# Patient Record
Sex: Female | Born: 1990 | Race: Black or African American | Hispanic: No | Marital: Single | Smoking: Never smoker
Health system: Southern US, Community
[De-identification: ages and names within clinical notes are randomized; demographics above are authoritative.]

## PROBLEM LIST (undated history)

## (undated) DIAGNOSIS — I10 Essential (primary) hypertension: Secondary | ICD-10-CM

## (undated) DIAGNOSIS — Z6841 Body Mass Index (BMI) 40.0 and over, adult: Secondary | ICD-10-CM

## (undated) HISTORY — DX: Morbid (severe) obesity due to excess calories: E66.01

## (undated) HISTORY — DX: Essential (primary) hypertension: I10

## (undated) HISTORY — PX: NO PAST SURGERIES: SHX2092

## (undated) HISTORY — DX: Body Mass Index (BMI) 40.0 and over, adult: Z684

---

## 2011-10-14 ENCOUNTER — Emergency Department: Payer: Self-pay | Admitting: Emergency Medicine

## 2014-04-26 ENCOUNTER — Emergency Department: Payer: Self-pay | Admitting: Emergency Medicine

## 2014-05-02 ENCOUNTER — Ambulatory Visit: Payer: Self-pay | Admitting: Emergency Medicine

## 2015-02-19 NOTE — L&D Delivery Note (Signed)
Delivery Note At  a viable and floppy female was delivered via  (Presentation:OA ;  ).  APGAR:5 ,8 ; weight  . 5#9oz  Placenta status: delivered intact with marginal cord insertion and 3 vessel  Short Cord:  with the following complications:variable decels, tight [place]- cut oon perineum as I was unable to reduce .  Cord pH: awaiting results-  Anesthesia:  none Episiotomy:  none Lacerations:  superficial- no repair needed Suture Repair: NA Est. Blood Loss (mL):    Mom to postpartum.  Baby to observing under warmer at the moment.  Melody NIKE Shambley, CNM 08/04/2015, 9:28 PM

## 2015-07-20 ENCOUNTER — Ambulatory Visit (INDEPENDENT_AMBULATORY_CARE_PROVIDER_SITE_OTHER): Payer: BLUE CROSS/BLUE SHIELD | Admitting: Obstetrics and Gynecology

## 2015-07-20 ENCOUNTER — Ambulatory Visit (INDEPENDENT_AMBULATORY_CARE_PROVIDER_SITE_OTHER): Payer: BLUE CROSS/BLUE SHIELD

## 2015-07-20 ENCOUNTER — Other Ambulatory Visit: Payer: Self-pay | Admitting: Obstetrics and Gynecology

## 2015-07-20 ENCOUNTER — Encounter: Payer: Self-pay | Admitting: Obstetrics and Gynecology

## 2015-07-20 VITALS — BP 132/80 | HR 124 | Ht 66.0 in | Wt 322.9 lb

## 2015-07-20 DIAGNOSIS — N926 Irregular menstruation, unspecified: Secondary | ICD-10-CM

## 2015-07-20 DIAGNOSIS — Z3493 Encounter for supervision of normal pregnancy, unspecified, third trimester: Secondary | ICD-10-CM

## 2015-07-20 DIAGNOSIS — N912 Amenorrhea, unspecified: Secondary | ICD-10-CM

## 2015-07-20 DIAGNOSIS — Z6841 Body Mass Index (BMI) 40.0 and over, adult: Secondary | ICD-10-CM

## 2015-07-20 DIAGNOSIS — Z331 Pregnant state, incidental: Secondary | ICD-10-CM | POA: Diagnosis not present

## 2015-07-20 DIAGNOSIS — Z3490 Encounter for supervision of normal pregnancy, unspecified, unspecified trimester: Secondary | ICD-10-CM

## 2015-07-20 LAB — POCT URINE PREGNANCY: PREG TEST UR: POSITIVE — AB

## 2015-07-20 NOTE — Patient Instructions (Signed)
1. Prenatal vitamins daily 2. Pelvic ultrasound to confirm intrauterine pregnancy and EDD 3. Return in 1 week for new OB nursing visit

## 2015-07-21 LAB — CBC WITH DIFFERENTIAL/PLATELET
BASOS: 0 %
Basophils Absolute: 0 10*3/uL (ref 0.0–0.2)
EOS (ABSOLUTE): 0 10*3/uL (ref 0.0–0.4)
Eos: 0 %
HEMATOCRIT: 39.2 % (ref 34.0–46.6)
Hemoglobin: 13.1 g/dL (ref 11.1–15.9)
IMMATURE GRANS (ABS): 0.1 10*3/uL (ref 0.0–0.1)
Immature Granulocytes: 1 %
LYMPHS: 15 %
Lymphocytes Absolute: 1.6 10*3/uL (ref 0.7–3.1)
MCH: 29.4 pg (ref 26.6–33.0)
MCHC: 33.4 g/dL (ref 31.5–35.7)
MCV: 88 fL (ref 79–97)
MONOS ABS: 1 10*3/uL — AB (ref 0.1–0.9)
Monocytes: 9 %
NEUTROS ABS: 8 10*3/uL — AB (ref 1.4–7.0)
Neutrophils: 75 %
PLATELETS: 344 10*3/uL (ref 150–379)
RBC: 4.45 x10E6/uL (ref 3.77–5.28)
RDW: 14.6 % (ref 12.3–15.4)
WBC: 10.7 10*3/uL (ref 3.4–10.8)

## 2015-07-21 LAB — ABO AND RH: RH TYPE: POSITIVE

## 2015-07-21 LAB — SICKLE CELL SCREEN: SICKLE CELL SCREEN: NEGATIVE

## 2015-07-21 LAB — HIV ANTIBODY (ROUTINE TESTING W REFLEX): HIV Screen 4th Generation wRfx: NONREACTIVE

## 2015-07-21 LAB — ANTIBODY SCREEN: Antibody Screen: NEGATIVE

## 2015-07-21 LAB — HEPATITIS B SURFACE ANTIGEN: Hepatitis B Surface Ag: NEGATIVE

## 2015-07-21 LAB — VARICELLA ZOSTER ANTIBODY, IGG: Varicella zoster IgG: 396 index (ref >165)

## 2015-07-21 LAB — RPR: RPR: NONREACTIVE

## 2015-07-21 LAB — RUBELLA SCREEN: RUBELLA: 4.79 {index} (ref >0.99)

## 2015-07-22 LAB — GC/CHLAMYDIA PROBE AMP
CHLAMYDIA, DNA PROBE: POSITIVE — AB
Neisseria gonorrhoeae by PCR: NEGATIVE

## 2015-07-22 LAB — URINALYSIS, ROUTINE W REFLEX MICROSCOPIC
Bilirubin, UA: NEGATIVE
GLUCOSE, UA: NEGATIVE
NITRITE UA: NEGATIVE
RBC, UA: NEGATIVE
Urobilinogen, Ur: 0.2 mg/dL (ref 0.2–1.0)
pH, UA: 6 (ref 5.0–7.5)

## 2015-07-22 LAB — MONITOR DRUG PROFILE 14(MW)
Amphetamine Scrn, Ur: NEGATIVE ng/mL
BARBITURATE SCREEN URINE: NEGATIVE ng/mL
BENZODIAZEPINE SCREEN, URINE: NEGATIVE ng/mL
BUPRENORPHINE, URINE: NEGATIVE ng/mL
CANNABINOIDS UR QL SCN: NEGATIVE ng/mL
COCAINE(METAB.)SCREEN, URINE: NEGATIVE ng/mL
Creatinine(Crt), U: 335 mg/dL — ABNORMAL HIGH (ref 20.0–300.0)
FENTANYL, URINE: NEGATIVE pg/mL
MEPERIDINE SCREEN, URINE: NEGATIVE ng/mL
Methadone Screen, Urine: NEGATIVE ng/mL
OPIATE SCREEN URINE: NEGATIVE ng/mL
OXYCODONE+OXYMORPHONE UR QL SCN: NEGATIVE ng/mL
PH UR, DRUG SCRN: 5.7 (ref 4.5–8.9)
PHENCYCLIDINE QUANTITATIVE URINE: NEGATIVE ng/mL
PROPOXYPHENE SCREEN URINE: NEGATIVE ng/mL
SPECIFIC GRAVITY: 1.02
TRAMADOL SCREEN, URINE: NEGATIVE ng/mL

## 2015-07-22 LAB — MICROSCOPIC EXAMINATION
CASTS: NONE SEEN /LPF
Epithelial Cells (non renal): >10 /hpf — AB (ref 0–10)

## 2015-07-22 LAB — NICOTINE SCREEN, URINE: COTININE UR QL SCN: NEGATIVE ng/mL

## 2015-07-22 LAB — CULTURE, OB URINE

## 2015-07-22 LAB — URINE CULTURE, OB REFLEX

## 2015-07-23 DIAGNOSIS — O0990 Supervision of high risk pregnancy, unspecified, unspecified trimester: Secondary | ICD-10-CM | POA: Insufficient documentation

## 2015-07-23 DIAGNOSIS — Z6841 Body Mass Index (BMI) 40.0 and over, adult: Secondary | ICD-10-CM

## 2015-07-23 DIAGNOSIS — Z3490 Encounter for supervision of normal pregnancy, unspecified, unspecified trimester: Secondary | ICD-10-CM | POA: Insufficient documentation

## 2015-07-23 DIAGNOSIS — N926 Irregular menstruation, unspecified: Secondary | ICD-10-CM | POA: Insufficient documentation

## 2015-07-23 NOTE — Progress Notes (Signed)
GYN ENCOUNTER NOTE  Subjective:       Kim Ali is a 25 y.o. G1P0 female is here for gynecologic evaluation of the following issues:  1. Amenorrhea. 2.  Pregnancy confirmation.  25 year old African-American female G1, P0, unsure last menstrual period, with positive urine pregnancy test, with history of irregular cycles, presents for pregnancy confirmation.  No history of vaginal bleeding or pe  Patient does report mild nausea and vomiting as well as breast tenderness..     Gynecologic History No LMP recorded (lmp unknown). Patient is pregnant. Contraception: None Menarche-age 46. Intervals-irregular. Dysmenorrhea-negative Pap smear history-no previous Pap smears  Obstetric History OB History  Gravida Para Term Preterm AB SAB TAB Ectopic Multiple Living  1             # Outcome Date GA Lbr Len/2nd Weight Sex Delivery Anes PTL Lv  1 Current               History reviewed. No pertinent past medical history. Seasonal allergies. Increased body mass index Past Surgical History  Procedure Laterality Date  . No past surgeries      No current outpatient prescriptions on file prior to visit.   No current facility-administered medications on file prior to visit.    Allergies  Allergen Reactions  . Penicillin G Other (See Comments)    Social History   Social History  . Marital Status: Single    Spouse Name: N/A  . Number of Children: N/A  . Years of Education: N/A   Occupational History  . Not on file.   Social History Main Topics  . Smoking status: Never Smoker   . Smokeless tobacco: Not on file  . Alcohol Use: Yes  . Drug Use: No  . Sexual Activity: Yes    Birth Control/ Protection: None   Other Topics Concern  . Not on file   Social History Narrative  . No narrative on file    Family History  Problem Relation Age of Onset  . Diabetes Maternal Uncle   . Diabetes Paternal Uncle   . Breast cancer Maternal Grandmother     grt  . Diabetes  Paternal Grandfather   . Ovarian cancer Neg Hx   . Colon cancer Neg Hx   . Heart disease Neg Hx     The following portions of the patient's history were reviewed and updated as appropriate: allergies, current medications, past family history, past medical history, past social history, past surgical history and problem list.  Review of Systems Review of Systems - General ROS: negative for - chills, fatigue, fever, hot flashes, malaise or night sweats Hematological and Lymphatic ROS: negative for - bleeding problems or swollen lymph nodes Gastrointestinal ROS: negative for - abdominal pain, blood in stools, change in bowel habits and nausea/vomiting Musculoskeletal ROS: negative for - joint pain, muscle pain or muscular weakness Genito-Urinary ROS: negative for - change in menstrual cycle, dysmenorrhea, dyspareunia, dysuria, genital discharge, genital ulcers, hematuria, incontinence, irregular/heavy menses, nocturia or pelvic painjj  Objective:   BP 132/80 mmHg  Pulse 124  Ht 5\' 6"  (1.676 m)  Wt 322 lb 14.4 oz (146.466 kg)  BMI 52.14 kg/m2  LMP  (LMP Unknown) CONSTITUTIONAL: Well-developed, well-nourished female in no acute distress.  Physical exam-deferred     Assessment:   1. Amenorrhea - POCT urine pregnancy  2. Irregular periods/menstrual cycles - US OB Transvaginal; Future  3. Pregnancy-positive UPT  4. Morbid obesity with BMI of 45.0-49.9, adult (HCC)  5.  Supervision of normal pregnancy, third trimester - ABO AND RH  - Antibody screen - CBC with Differential/Platelet - Culture, OB Urine - GC/Chlamydia Probe Amp - Hepatitis B surface antigen - HIV antibody - Monitor Drug Profile 14(MW) - Nicotine screen, urine - RPR - Rubella screen - Sickle cell screen - Urinalysis, Routine w reflex microscopic (not at [facility]) - Varicella zoster antibody, IgG     Plan:  1.  Prenatal labs. 2.  Ultrasound to confirm estimated gestational age and EDC 3.  Prenatal vitamins  daily. 4.New OB counseling: The patient has been given an overview regarding routine prenatal care. Recommendations regarding diet, weight gain, and exercise in pregnancy were given. Prenatal testing, optional genetic testing, and ultrasound use in pregnancy were reviewed.  Benefits of Breast Feeding were discussed. The patient is encouraged to consider nursing her baby post partum.  A total of 30 minutes were spent face-to-face with the patient during the encounter with greater than 50% dealing with counseling and coordination of care.  Herold Harms, MD  Note: This dictation was prepared with Dragon dictation along with smaller phrase technology. Any transcriptional errors that result from this process are unintentional.

## 2015-07-24 ENCOUNTER — Telehealth: Payer: Self-pay

## 2015-07-24 MED ORDER — AZITHROMYCIN 1 G PO PACK: 1.0000 g | PACK | Freq: Once | ORAL | Status: DC

## 2015-07-24 NOTE — Telephone Encounter (Signed)
-----   Message from Herold HarmsMartin A Defrancesco, MD sent at 07/23/2015 11:48 AM EDT ----- Please notify - Abnormal Labs Zithromax 1 g by mouth is recommended; partner needs treated; [facility] needs notification

## 2015-07-24 NOTE — Telephone Encounter (Signed)
Pt aware. Med erx. Pt aware to have partner or partners treated. Will need toc in 4 weeks.

## 2015-07-26 ENCOUNTER — Ambulatory Visit (INDEPENDENT_AMBULATORY_CARE_PROVIDER_SITE_OTHER): Payer: BLUE CROSS/BLUE SHIELD | Admitting: Obstetrics and Gynecology

## 2015-07-26 ENCOUNTER — Encounter: Payer: Self-pay | Admitting: Obstetrics and Gynecology

## 2015-07-26 VITALS — BP 131/97 | HR 100 | Wt 321.6 lb

## 2015-07-26 DIAGNOSIS — Z113 Encounter for screening for infections with a predominantly sexual mode of transmission: Secondary | ICD-10-CM

## 2015-07-26 DIAGNOSIS — Z1389 Encounter for screening for other disorder: Secondary | ICD-10-CM

## 2015-07-26 DIAGNOSIS — Z369 Encounter for antenatal screening, unspecified: Secondary | ICD-10-CM

## 2015-07-26 DIAGNOSIS — Z36 Encounter for antenatal screening of mother: Secondary | ICD-10-CM

## 2015-07-26 DIAGNOSIS — O0933 Supervision of pregnancy with insufficient antenatal care, third trimester: Secondary | ICD-10-CM | POA: Insufficient documentation

## 2015-07-26 DIAGNOSIS — Z23 Encounter for immunization: Secondary | ICD-10-CM

## 2015-07-26 LAB — POCT URINALYSIS DIPSTICK
Bilirubin, UA: NEGATIVE
Blood, UA: NEGATIVE
GLUCOSE UA: NEGATIVE
Ketones, UA: NEGATIVE
Leukocytes, UA: NEGATIVE
NITRITE UA: NEGATIVE
PROTEIN UA: NEGATIVE
SPEC GRAV UA: 1.025
UROBILINOGEN UA: NEGATIVE
pH, UA: 6

## 2015-07-26 MED ORDER — TETANUS-DIPHTH-ACELL PERTUSSIS 5-2.5-18.5 LF-MCG/0.5 IM SUSP
0.5000 mL | Freq: Once | INTRAMUSCULAR | Status: AC
  Administered 2015-07-26: 0.5 mL via INTRAMUSCULAR

## 2015-07-26 NOTE — Patient Instructions (Addendum)
Braxton Hicks Contractions Contractions of the uterus can occur throughout pregnancy. Contractions are not always a sign that you are in labor.  WHAT ARE BRAXTON HICKS CONTRACTIONS?  Contractions that occur before labor are called Braxton Hicks contractions, or false labor. Toward the end of pregnancy (32-34 weeks), these contractions can develop more often and may become more forceful. This is not true labor because these contractions do not result in opening (dilatation) and thinning of the cervix. They are sometimes difficult to tell apart from true labor because these contractions can be forceful and people have different pain tolerances. You should not feel embarrassed if you go to the hospital with false labor. Sometimes, the only way to tell if you are in true labor is for your health care provider to look for changes in the cervix. If there are no prenatal problems or other health problems associated with the pregnancy, it is completely safe to be sent home with false labor and await the onset of true labor. HOW CAN YOU TELL THE DIFFERENCE BETWEEN TRUE AND FALSE LABOR? False Labor  The contractions of false labor are usually shorter and not as hard as those of true labor.   The contractions are usually irregular.   The contractions are often felt in the front of the lower abdomen and in the groin.   The contractions may go away when you walk around or change positions while lying down.   The contractions get weaker and are shorter lasting as time goes on.   The contractions do not usually become progressively stronger, regular, and closer together as with true labor.  True Labor  Contractions in true labor last 30-70 seconds, become very regular, usually become more intense, and increase in frequency.   The contractions do not go away with walking.   The discomfort is usually felt in the top of the uterus and spreads to the lower abdomen and low back.   True labor can be  determined by your health care provider with an exam. This will show that the cervix is dilating and getting thinner.  WHAT TO REMEMBER  Keep up with your usual exercises and follow other instructions given by your health care provider.   Take medicines as directed by your health care provider.   Keep your regular prenatal appointments.   Eat and drink lightly if you think you are going into labor.   If Braxton Hicks contractions are making you uncomfortable:   Change your position from lying down or resting to walking, or from walking to resting.   Sit and rest in a tub of warm water.   Drink 2-3 glasses of water. Dehydration may cause these contractions.   Do slow and deep breathing several times an hour.  WHEN SHOULD I SEEK IMMEDIATE MEDICAL CARE? Seek immediate medical care if:  Your contractions become stronger, more regular, and closer together.   You have fluid leaking or gushing from your vagina.   You have a fever.   You pass blood-tinged mucus.   You have vaginal bleeding.   You have continuous abdominal pain.   You have low back pain that you never had before.   You feel your baby's head pushing down and causing pelvic pressure.   Your baby is not moving as much as it used to.    This information is not intended to replace advice given to you by your health care provider. Make sure you discuss any questions you have with your health care   provider.   Document Released: 02/04/2005 Document Revised: 02/09/2013 Document Reviewed: 11/16/2012 Elsevier Interactive Patient Education 2016 Elsevier Inc.  Tdap Vaccine (Tetanus, Diphtheria and Pertussis): What You Need to Know 1. Why get vaccinated? Tetanus, diphtheria and pertussis are very serious diseases. Tdap vaccine can protect us from these diseases. And, Tdap vaccine given to pregnant women can protect newborn babies against pertussis. TETANUS (Lockjaw) is rare in the [place] today. It  causes painful muscle tightening and stiffness, usually all over the body.  It can lead to tightening of muscles in the head and neck so you can't open your mouth, swallow, or sometimes even breathe. Tetanus kills about 1 out of 10 people who are infected even after receiving the best medical care. DIPHTHERIA is also rare in the [place] today. It can cause a thick coating to form in the back of the throat.  It can lead to breathing problems, heart failure, paralysis, and death. PERTUSSIS (Whooping Cough) causes severe coughing spells, which can cause difficulty breathing, vomiting and disturbed sleep.  It can also lead to weight loss, incontinence, and rib fractures. Up to 2 in 100 adolescents and 5 in 100 adults with pertussis are hospitalized or have complications, which could include pneumonia or death. These diseases are caused by bacteria. Diphtheria and pertussis are spread from person to person through secretions from coughing or sneezing. Tetanus enters the body through cuts, scratches, or wounds. Before vaccines, as many as 200,000 cases of diphtheria, 200,000 cases of pertussis, and hundreds of cases of tetanus, were reported in the [place] each year. Since vaccination began, reports of cases for tetanus and diphtheria have dropped by about 99% and for pertussis by about 80%. 2. Tdap vaccine Tdap vaccine can protect adolescents and adults from tetanus, diphtheria, and pertussis. One dose of Tdap is routinely given at age 11 or 12. People who did not get Tdap at that age should get it as soon as possible. Tdap is especially important for healthcare professionals and anyone having close contact with a baby younger than 12 months. Pregnant women should get a dose of Tdap during every pregnancy, to protect the newborn from pertussis. Infants are most at risk for severe, life-threatening complications from pertussis. Another vaccine, called Td, protects against tetanus and diphtheria,  but not pertussis. A Td booster should be given every 10 years. Tdap may be given as one of these boosters if you have never gotten Tdap before. Tdap may also be given after a severe cut or burn to prevent tetanus infection. Your doctor or the person giving you the vaccine can give you more information. Tdap may safely be given at the same time as other vaccines. 3. Some people should not get this vaccine  A person who has ever had a life-threatening allergic reaction after a previous dose of any diphtheria, tetanus or pertussis containing vaccine, OR has a severe allergy to any part of this vaccine, should not get Tdap vaccine. Tell the person giving the vaccine about any severe allergies.  Anyone who had coma or long repeated seizures within 7 days after a childhood dose of DTP or DTaP, or a previous dose of Tdap, should not get Tdap, unless a cause other than the vaccine was found. They can still get Td.  Talk to your doctor if you:  have seizures or another nervous system problem,  had severe pain or swelling after any vaccine containing diphtheria, tetanus or pertussis,  ever had a condition called Guillain-Barr Syndrome (  GBS),  aren't feeling well on the day the shot is scheduled. 4. Risks With any medicine, including vaccines, there is a chance of side effects. These are usually mild and go away on their own. Serious reactions are also possible but are rare. Most people who get Tdap vaccine do not have any problems with it. Mild problems following Tdap (Did not interfere with activities)  Pain where the shot was given (about 3 in 4 adolescents or 2 in 3 adults)  Redness or swelling where the shot was given (about 1 person in 5)  Mild fever of at least 100.4F (up to about 1 in 25 adolescents or 1 in 100 adults)  Headache (about 3 or 4 people in 10)  Tiredness (about 1 person in 3 or 4)  Nausea, vomiting, diarrhea, stomach ache (up to 1 in 4 adolescents or 1 in 10  adults)  Chills, sore joints (about 1 person in 10)  Body aches (about 1 person in 3 or 4)  Rash, swollen glands (uncommon) Moderate problems following Tdap (Interfered with activities, but did not require medical attention)  Pain where the shot was given (up to 1 in 5 or 6)  Redness or swelling where the shot was given (up to about 1 in 16 adolescents or 1 in 12 adults)  Fever over 102F (about 1 in 100 adolescents or 1 in 250 adults)  Headache (about 1 in 7 adolescents or 1 in 10 adults)  Nausea, vomiting, diarrhea, stomach ache (up to 1 or 3 people in 100)  Swelling of the entire arm where the shot was given (up to about 1 in 500). Severe problems following Tdap (Unable to perform usual activities; required medical attention)  Swelling, severe pain, bleeding and redness in the arm where the shot was given (rare). Problems that could happen after any vaccine:  People sometimes faint after a medical procedure, including vaccination. Sitting or lying down for about 15 minutes can help prevent fainting, and injuries caused by a fall. Tell your doctor if you feel dizzy, or have vision changes or ringing in the ears.  Some people get severe pain in the shoulder and have difficulty moving the arm where a shot was given. This happens very rarely.  Any medication can cause a severe allergic reaction. Such reactions from a vaccine are very rare, estimated at fewer than 1 in a million doses, and would happen within a few minutes to a few hours after the vaccination. As with any medicine, there is a very remote chance of a vaccine causing a serious injury or death. The safety of vaccines is always being monitored. For more information, visit: www.cdc.gov/vaccinesafety/ 5. What if there is a serious problem? What should I look for?  Look for anything that concerns you, such as signs of a severe allergic reaction, very high fever, or unusual behavior.  Signs of a severe allergic reaction  can include hives, swelling of the face and throat, difficulty breathing, a fast heartbeat, dizziness, and weakness. These would usually start a few minutes to a few hours after the vaccination. What should I do?  If you think it is a severe allergic reaction or other emergency that can't wait, call 9-1-1 or get the person to the nearest hospital. Otherwise, call your doctor.  Afterward, the reaction should be reported to the Vaccine Adverse Event Reporting System (VAERS). Your doctor might file this report, or you can do it yourself through the VAERS web site at www.vaers.hhs.gov, or by calling   1-800-822-7967. VAERS does not give medical advice.  6. The National Vaccine Injury Compensation Program The National Vaccine Injury Compensation Program (VICP) is a federal program that was created to compensate people who may have been injured by certain vaccines. Persons who believe they may have been injured by a vaccine can learn about the program and about filing a claim by calling 1-800-338-2382 or visiting the VICP website at www.hrsa.gov/vaccinecompensation. There is a time limit to file a claim for compensation. 7. How can I learn more?  Ask your doctor. He or she can give you the vaccine package insert or suggest other sources of information.  Call your local or state health department.  Contact the Centers for Disease Control and Prevention (CDC):  Call 1-800-232-4636 (1-800-CDC-INFO) or  Visit CDC's website at www.cdc.gov/vaccines CDC Tdap Vaccine VIS (04/13/13)   This information is not intended to replace advice given to you by your health care provider. Make sure you discuss any questions you have with your health care provider.   Document Released: 08/06/2011 Document Revised: 02/25/2014 Document Reviewed: 05/19/2013 Elsevier Interactive Patient Education 2016 Elsevier Inc.   

## 2015-07-26 NOTE — Progress Notes (Signed)
NEW OB HISTORY AND PHYSICAL  SUBJECTIVE:       Kim Ali is a 25 y.o. G1P0 female, No LMP recorded (lmp unknown). Patient is pregnant., Estimated Date of Delivery: 08/20/15, 7047w3d, presents today for establishment of Prenatal Care. She has no unusual complaints and complains of nothing      Gynecologic History No LMP recorded (lmp unknown). Patient is pregnant. Unknown Contraception: none Last Pap: unsure. Results were: normal  Obstetric History OB History  Gravida Para Term Preterm AB SAB TAB Ectopic Multiple Living  1             # Outcome Date GA Lbr Len/2nd Weight Sex Delivery Anes PTL Lv  1 Current               History reviewed. No pertinent past medical history.  Past Surgical History  Procedure Laterality Date  . No past surgeries      Current Outpatient Prescriptions on File Prior to Visit  Medication Sig Dispense Refill  . azithromycin (ZITHROMAX) 1 g powder Take 1 packet (1 g total) by mouth once. 1 each 0   No current facility-administered medications on file prior to visit.    Allergies  Allergen Reactions  . Penicillin G Other (See Comments)    Social History   Social History  . Marital Status: Single    Spouse Name: N/A  . Number of Children: N/A  . Years of Education: N/A   Occupational History  . Not on file.   Social History Main Topics  . Smoking status: Never Smoker   . Smokeless tobacco: Never Used  . Alcohol Use: 0.0 oz/week    0 Standard drinks or equivalent per week  . Drug Use: No  . Sexual Activity:    Partners: Male    Birth Control/ Protection: None   Other Topics Concern  . Not on file   Social History Narrative    Family History  Problem Relation Age of Onset  . Diabetes Maternal Uncle   . Diabetes Paternal Uncle   . Breast cancer Maternal Grandmother     grt  . Diabetes Paternal Grandfather   . Ovarian cancer Neg Hx   . Colon cancer Neg Hx   . Heart disease Neg Hx     The following portions of  the patient's history were reviewed and updated as appropriate: allergies, current medications, past OB history, past medical history, past surgical history, past family history, past social history, and problem list.    OBJECTIVE: Initial Physical Exam (New OB)  GENERAL APPEARANCE: alert, well appearing, in no apparent distress, oriented to person, place and time HEAD: normocephalic, atraumatic MOUTH: mucous membranes moist, pharynx normal without lesions and dental hygiene good THYROID: no thyromegaly or masses present BREASTS: not examined LUNGS: clear to auscultation, no wheezes, rales or rhonchi, symmetric air entry HEART: regular rate and rhythm, no murmurs ABDOMEN: obese, fundus soft, nontender 37 cm and FHT present EXTREMITIES: no redness or tenderness in the calves or thighs SKIN: normal coloration and turgor, no rashes LYMPH NODES: no adenopathy palpable NEUROLOGIC: alert, oriented, normal speech, no focal findings or movement disorder noted  PELVIC EXAM EXTERNAL GENITALIA: normal appearing vulva with no masses, tenderness or lesions VAGINA: no abnormal discharge or lesions CERVIX: no lesions or cervical motion tenderness UTERUS: gravid and consistent with 37 weeks  ASSESSMENT: IUP with late entry to care Morbid obesity +CMZ- treated 07/20/15  PLAN: Prenatal care TOC in 3 weeks, labor precautions discussed See orders

## 2015-07-31 ENCOUNTER — Other Ambulatory Visit: Payer: Self-pay | Admitting: Obstetrics and Gynecology

## 2015-07-31 DIAGNOSIS — O99820 Streptococcus B carrier state complicating pregnancy: Secondary | ICD-10-CM

## 2015-08-01 ENCOUNTER — Other Ambulatory Visit: Payer: Self-pay | Admitting: Obstetrics and Gynecology

## 2015-08-01 LAB — STREP GP B SUSCEPTIBILITY

## 2015-08-01 LAB — STREP GP B NAA+RFLX: Strep Gp B NAA+Rflx: POSITIVE — AB

## 2015-08-02 ENCOUNTER — Ambulatory Visit (INDEPENDENT_AMBULATORY_CARE_PROVIDER_SITE_OTHER): Payer: BLUE CROSS/BLUE SHIELD | Admitting: Obstetrics and Gynecology

## 2015-08-02 ENCOUNTER — Encounter: Payer: Self-pay | Admitting: Obstetrics and Gynecology

## 2015-08-02 VITALS — BP 130/80 | HR 76 | Wt 323.4 lb

## 2015-08-02 DIAGNOSIS — Z22330 Carrier of Group B streptococcus: Secondary | ICD-10-CM

## 2015-08-02 DIAGNOSIS — Z6841 Body Mass Index (BMI) 40.0 and over, adult: Secondary | ICD-10-CM

## 2015-08-02 DIAGNOSIS — O99820 Streptococcus B carrier state complicating pregnancy: Secondary | ICD-10-CM

## 2015-08-02 DIAGNOSIS — O0933 Supervision of pregnancy with insufficient antenatal care, third trimester: Secondary | ICD-10-CM

## 2015-08-02 DIAGNOSIS — Z3483 Encounter for supervision of other normal pregnancy, third trimester: Secondary | ICD-10-CM

## 2015-08-02 DIAGNOSIS — Z3493 Encounter for supervision of normal pregnancy, unspecified, third trimester: Secondary | ICD-10-CM

## 2015-08-02 LAB — POCT URINALYSIS DIPSTICK
Bilirubin, UA: NEGATIVE
Glucose, UA: NEGATIVE
Ketones, UA: NEGATIVE
Leukocytes, UA: NEGATIVE
NITRITE UA: NEGATIVE
PH UA: 6
RBC UA: NEGATIVE
SPEC GRAV UA: 1.025
UROBILINOGEN UA: NEGATIVE

## 2015-08-02 NOTE — Progress Notes (Signed)
ROB: Denies major complaints. GBS+, will need abx in labor. Given handout. Discussed contraception, considering IUD. Patient considering breastfeeding. Will need TOC for Chlamydia in 2 weeks. Labor precautions. RTC in 1 week.

## 2015-08-04 ENCOUNTER — Encounter: Payer: Self-pay | Admitting: *Deleted

## 2015-08-04 ENCOUNTER — Inpatient Hospital Stay
Admission: EM | Admit: 2015-08-04 | Discharge: 2015-08-06 | DRG: 775 | Disposition: A | Discharge status: Home / Self Care | Payer: BLUE CROSS/BLUE SHIELD | Attending: Obstetrics and Gynecology | Admitting: Obstetrics and Gynecology

## 2015-08-04 DIAGNOSIS — Z6841 Body Mass Index (BMI) 40.0 and over, adult: Secondary | ICD-10-CM | POA: Diagnosis not present

## 2015-08-04 DIAGNOSIS — Z3403 Encounter for supervision of normal first pregnancy, third trimester: Secondary | ICD-10-CM

## 2015-08-04 DIAGNOSIS — Z3A37 37 weeks gestation of pregnancy: Secondary | ICD-10-CM

## 2015-08-04 DIAGNOSIS — Z880 Allergy status to penicillin: Secondary | ICD-10-CM

## 2015-08-04 DIAGNOSIS — O99214 Obesity complicating childbirth: Secondary | ICD-10-CM | POA: Diagnosis present

## 2015-08-04 DIAGNOSIS — O4202 Full-term premature rupture of membranes, onset of labor within 24 hours of rupture: Principal | ICD-10-CM | POA: Diagnosis present

## 2015-08-04 DIAGNOSIS — O4290 Premature rupture of membranes, unspecified as to length of time between rupture and onset of labor, unspecified weeks of gestation: Secondary | ICD-10-CM | POA: Diagnosis present

## 2015-08-04 DIAGNOSIS — Z3490 Encounter for supervision of normal pregnancy, unspecified, unspecified trimester: Secondary | ICD-10-CM

## 2015-08-04 DIAGNOSIS — O99824 Streptococcus B carrier state complicating childbirth: Secondary | ICD-10-CM | POA: Diagnosis present

## 2015-08-04 LAB — CBC
HCT: 38.8 % (ref 35.0–47.0)
HEMOGLOBIN: 13 g/dL (ref 12.0–16.0)
MCH: 29.4 pg (ref 26.0–34.0)
MCHC: 33.3 g/dL (ref 32.0–36.0)
MCV: 88.1 fL (ref 80.0–100.0)
PLATELETS: 288 10*3/uL (ref 150–440)
RBC: 4.41 MIL/uL (ref 3.80–5.20)
RDW: 15 % — ABNORMAL HIGH (ref 11.5–14.5)
WBC: 11.6 10*3/uL — ABNORMAL HIGH (ref 3.6–11.0)

## 2015-08-04 LAB — TYPE AND SCREEN
ABO/RH(D): A POS
Antibody Screen: NEGATIVE

## 2015-08-04 LAB — CHLAMYDIA/NGC RT PCR (ARMC ONLY)
CHLAMYDIA TR: NOT DETECTED
N gonorrhoeae: NOT DETECTED

## 2015-08-04 MED ORDER — OXYTOCIN 40 UNITS IN LACTATED RINGERS INFUSION - SIMPLE MED
INTRAVENOUS | Status: AC
  Administered 2015-08-04: 2 m[IU]/min via INTRAVENOUS
  Filled 2015-08-04: qty 1000

## 2015-08-04 MED ORDER — SOD CITRATE-CITRIC ACID 500-334 MG/5ML PO SOLN
ORAL | Status: AC
  Filled 2015-08-04: qty 15

## 2015-08-04 MED ORDER — LIDOCAINE HCL (PF) 1 % IJ SOLN
INTRAMUSCULAR | Status: AC
Start: 2015-08-04 — End: 2015-08-04
  Filled 2015-08-04: qty 30

## 2015-08-04 MED ORDER — LACTATED RINGERS IV SOLN
INTRAVENOUS | Status: DC
  Administered 2015-08-04: 125 mL/h via INTRAVENOUS

## 2015-08-04 MED ORDER — OXYTOCIN 40 UNITS IN LACTATED RINGERS INFUSION - SIMPLE MED
1.0000 m[IU]/min | INTRAVENOUS | Status: DC
  Administered 2015-08-04: 2 m[IU]/min via INTRAVENOUS

## 2015-08-04 MED ORDER — FENTANYL CITRATE (PF) 100 MCG/2ML IJ SOLN
50.0000 ug | INTRAMUSCULAR | Status: DC | PRN
  Administered 2015-08-04 (×4): 100 ug via INTRAVENOUS
  Filled 2015-08-04 (×4): qty 2

## 2015-08-04 MED ORDER — ONDANSETRON HCL 4 MG/2ML IJ SOLN: 4.0000 mg | Freq: Four times a day (QID) | INTRAMUSCULAR | Status: DC | PRN

## 2015-08-04 MED ORDER — SOD CITRATE-CITRIC ACID 500-334 MG/5ML PO SOLN: 30.0000 mL | ORAL | Status: DC | PRN

## 2015-08-04 MED ORDER — CLINDAMYCIN PHOSPHATE 900 MG/50ML IV SOLN
900.0000 mg | Freq: Three times a day (TID) | INTRAVENOUS | Status: DC
  Administered 2015-08-04 (×2): 900 mg via INTRAVENOUS
  Filled 2015-08-04 (×4): qty 50

## 2015-08-04 MED ORDER — TERBUTALINE SULFATE 1 MG/ML IJ SOLN: 0.2500 mg | Freq: Once | INTRAMUSCULAR | Status: DC | PRN

## 2015-08-04 MED ORDER — OXYTOCIN 10 UNIT/ML IJ SOLN
INTRAMUSCULAR | Status: AC
  Filled 2015-08-04: qty 2

## 2015-08-04 MED ORDER — OXYTOCIN 40 UNITS IN LACTATED RINGERS INFUSION - SIMPLE MED
2.5000 [IU]/h | INTRAVENOUS | Status: DC
  Administered 2015-08-04: 39.96 [IU]/h via INTRAVENOUS

## 2015-08-04 MED ORDER — LACTATED RINGERS IV SOLN
500.0000 mL | INTRAVENOUS | Status: DC | PRN
  Administered 2015-08-04: 500 mL via INTRAVENOUS

## 2015-08-04 MED ORDER — IBUPROFEN 600 MG PO TABS
ORAL_TABLET | ORAL | Status: AC
  Administered 2015-08-04: 600 mg via ORAL
  Filled 2015-08-04: qty 1

## 2015-08-04 MED ORDER — AMMONIA AROMATIC IN INHA
RESPIRATORY_TRACT | Status: AC
  Filled 2015-08-04: qty 10

## 2015-08-04 MED ORDER — MISOPROSTOL 200 MCG PO TABS
ORAL_TABLET | ORAL | Status: AC
  Filled 2015-08-04: qty 4

## 2015-08-04 MED ORDER — LIDOCAINE HCL (PF) 1 % IJ SOLN: 30.0000 mL | INTRAMUSCULAR | Status: DC | PRN

## 2015-08-04 MED ORDER — ACETAMINOPHEN 325 MG PO TABS: 650.0000 mg | ORAL_TABLET | ORAL | Status: DC | PRN

## 2015-08-04 MED ORDER — IBUPROFEN 600 MG PO TABS
600.0000 mg | ORAL_TABLET | Freq: Four times a day (QID) | ORAL | Status: DC
  Administered 2015-08-04 – 2015-08-06 (×7): 600 mg via ORAL
  Filled 2015-08-04 (×6): qty 1

## 2015-08-04 MED ORDER — OXYTOCIN BOLUS FROM INFUSION: 500.0000 mL | INTRAVENOUS | Status: DC

## 2015-08-04 MED ORDER — TERBUTALINE SULFATE 1 MG/ML IJ SOLN
INTRAMUSCULAR | Status: AC
  Filled 2015-08-04: qty 1

## 2015-08-04 NOTE — Progress Notes (Signed)
Kim Ali is a 25 y.o. G1P0 at 3570w5d by ultrasound admitted for rupture of membranes at 0800 this am  Subjective: Denies pain  Objective: BP 143/86 mmHg  Pulse 107  Temp(Src) 98.1 F (36.7 C) (Oral)  Resp 18  Ht 5\' 6"  (1.676 m)  Wt 320 lb (145.151 kg)  BMI 51.67 kg/m2  SpO2 95%  LMP  (LMP Unknown)      FHT:  FHR: 160 bpm, variability: moderate,  accelerations:  Present,  decelerations:  Present spontaneous prolonged decel after getting up to void x 3 minutes, with slow return to baseline. UC:   irregular, every 9-15 minutes, not noted by patient SVE:   Dilation: 4 Effacement (%): 80 Station: -3 Exam by:: [employer]ner, RN  Labs: Lab Results  Component Value Date   WBC 11.6* 08/04/2015   HGB 13.0 08/04/2015   HCT 38.8 08/04/2015   MCV 88.1 08/04/2015   PLT 288 08/04/2015    Assessment / Plan: PPROM with +GBS  Labor: not contracting will start pitocin after GBS prophylaxis established. Preeclampsia:  labs stable Fetal Wellbeing:  Category I Pain Control:  Labor support without medications I/D:  n/a Anticipated MOD:  NSVD  Elray Dains Suzan Nailer Ariana Juul, CNM 08/04/2015, 12:37 PM

## 2015-08-04 NOTE — Consult Note (Signed)
Notified by RN of patient's Hx of chlamydia which was treated 6/1 but no test of cure.  AAP Red Book recommends against prophylactic Rx of newborns even if mother is untreated (due to lack of evidence of effectiveness and association of macrolide Rx with increased risk of pyloric stenosis).  These infants are at increased risk for conjunctivitis or lower respiratory tract infection, usually onset > 2 wk and should be monitored for these conditions by the pediatrician.  Michalla Ringer E. Barrie DunkerWimmer, Jr., MD Neonatologist

## 2015-08-04 NOTE — Progress Notes (Signed)
12:12: RN to the bedside to adjust U/S for fetal heart rate; pt. just getting back in bed from BR.  1214: FHR 70s-80s. Pt positioned to right side, FHR (80s), fetal scalp stim applied - FHR 90s; pt. turned to left side, pt. to elbow and knees - FHR 90s; pt. to left side position, no increase in FHR; pt. to right lateral position - FHR increased to 120s.  Provider called to the bedside.

## 2015-08-04 NOTE — H&P (Signed)
Obstetric History and Physical  Kim Ali is a 25 y.o. G1P0 with IUP at 7233w5d presenting with PROM. Patient states she has been having  none contractions, none vaginal bleeding, ruptured, clear fluid membranes, with active fetal movement.    [facility]  Pregnancy complications or risks:morbid obesity  Prenatal labs and studies: ABO, Rh: A/Positive/-- (06/01 1700) Antibody: Negative (06/01 1700) Rubella: 4.79 (06/01 1700) RPR: Non Reactive (06/01 1700)  HBsAg: Negative (06/01 1700)  HIV: Non Reactive (06/01 1700)  GBS:  1 hr Glucola  normal Genetic screening normal Anatomy US normal  Past Medical History  Diagnosis Date  . Morbid obesity with BMI of 45.0-49.9, adult [facility](HCC)     Past Surgical History  Procedure Laterality Date  . No past surgeries      OB History  Gravida Para Term Preterm AB SAB TAB Ectopic Multiple Living  1             # Outcome Date GA Lbr Len/2nd Weight Sex Delivery Anes PTL Lv  1 Current               Social History   Social History  . Marital Status: Single    Spouse Name: N/A  . Number of Children: N/A  . Years of Education: N/A   Social History Main Topics  . Smoking status: Never Smoker   . Smokeless tobacco: Never Used  . Alcohol Use: 0.0 oz/week    0 Standard drinks or equivalent per week  . Drug Use: No  . Sexual Activity:    Partners: Male    Birth Control/ Protection: None   Other Topics Concern  . Not on file   Social History Narrative    Family History  Problem Relation Age of Onset  . Diabetes Maternal Uncle   . Diabetes Paternal Uncle   . Breast cancer Maternal Grandmother     grt  . Diabetes Paternal Grandfather   . Ovarian cancer Neg Hx   . Colon cancer Neg Hx   . Heart disease Neg Hx     Prescriptions prior to admission  Medication Sig Dispense Refill Last Dose  . Prenatal Vit-Fe Fumarate-FA (MULTIVITAMIN-PRENATAL) 27-0.8 MG TABS tablet Take 1 tablet by mouth daily at  12 noon.   Taking    Allergies  Allergen Reactions  . Penicillin G Other (See Comments)    Review of Systems: Negative except for what is mentioned in HPI.  Physical Exam: BP 143/86 mmHg  Pulse 107  Temp(Src) 98.1 F (36.7 C) (Oral)  Resp 18  Ht 5\' 6"  (1.676 m)  Wt 320 lb (145.151 kg)  BMI 51.67 kg/m2  LMP  (LMP Unknown) GENERAL: Well-developed, well-nourished female in no acute distress.  LUNGS: Clear to auscultation bilaterally.  HEART: Regular rate and rhythm. ABDOMEN: Soft, nontender, nondistended, gravid. EXTREMITIES: Nontender, no edema, 2+ distal pulses. Cervical Exam: Dilation: 4 Effacement (%): 80 Cervical Position: Middle Station: -3 Presentation: Vertex Exam by:: Millner, RN FHT:  Baseline rate 140 bpm   Variability moderate  Accelerations present   Decelerations none Contractions: none detected yet   Pertinent Labs/Studies:   No results found for this or any previous visit (from the past 24 hour(s)).  Assessment : Kim Ali is a 25 y.o. G1P0 at 8533w5d being admitted for labor.  Plan: Labor: Expectant management.  Induction/Augmentation as needed, per protocol FWB: Reassuring fetal heart tracing.  GBS positive- prophylaxis started. Delivery plan: Hopeful for vaginal delivery  Melody  Aura Camps, CNM [facility]

## 2015-08-04 NOTE — Progress Notes (Signed)
Provider to the bedside to evaluate patient and also placed FSE. Stable pt.

## 2015-08-04 NOTE — Progress Notes (Signed)
Marker given to patient to press button when contracting, pt. verbalized understanding.

## 2015-08-04 NOTE — Progress Notes (Signed)
Spoke with Dr. Eric FormWimmer, MD regarding TOC-chlamydia (pt. treated on June 1st for Positive chlamydia test) for newly admitted patient.  MD to have follow-up conversation with Melody, CNM (pt.'s provider).

## 2015-08-05 LAB — CBC
HEMATOCRIT: 37.6 % (ref 35.0–47.0)
HEMOGLOBIN: 12.5 g/dL (ref 12.0–16.0)
MCH: 29.8 pg (ref 26.0–34.0)
MCHC: 33.3 g/dL (ref 32.0–36.0)
MCV: 89.4 fL (ref 80.0–100.0)
Platelets: 257 10*3/uL (ref 150–440)
RBC: 4.21 MIL/uL (ref 3.80–5.20)
RDW: 14.9 % — AB (ref 11.5–14.5)
WBC: 17 10*3/uL — ABNORMAL HIGH (ref 3.6–11.0)

## 2015-08-05 LAB — RPR: RPR: NONREACTIVE

## 2015-08-05 MED ORDER — ONDANSETRON HCL 4 MG PO TABS: 4.0000 mg | ORAL_TABLET | ORAL | Status: DC | PRN

## 2015-08-05 MED ORDER — ACETAMINOPHEN 325 MG PO TABS: 650.0000 mg | ORAL_TABLET | ORAL | Status: DC | PRN

## 2015-08-05 MED ORDER — DIBUCAINE 1 % RE OINT: 1.0000 "application " | TOPICAL_OINTMENT | RECTAL | Status: DC | PRN

## 2015-08-05 MED ORDER — ONDANSETRON HCL 4 MG/2ML IJ SOLN: 4.0000 mg | INTRAMUSCULAR | Status: DC | PRN

## 2015-08-05 MED ORDER — SIMETHICONE 80 MG PO CHEW: 80.0000 mg | CHEWABLE_TABLET | ORAL | Status: DC | PRN

## 2015-08-05 MED ORDER — DOCUSATE SODIUM 100 MG PO CAPS
100.0000 mg | ORAL_CAPSULE | Freq: Two times a day (BID) | ORAL | Status: DC
  Administered 2015-08-05 – 2015-08-06 (×2): 100 mg via ORAL
  Filled 2015-08-05 (×2): qty 1

## 2015-08-05 MED ORDER — PRENATAL MULTIVITAMIN CH
1.0000 | ORAL_TABLET | Freq: Every day | ORAL | Status: DC
  Administered 2015-08-05 – 2015-08-06 (×2): 1 via ORAL
  Filled 2015-08-05 (×2): qty 1

## 2015-08-05 MED ORDER — DIPHENHYDRAMINE HCL 25 MG PO CAPS: 25.0000 mg | ORAL_CAPSULE | Freq: Four times a day (QID) | ORAL | Status: DC | PRN

## 2015-08-05 MED ORDER — BENZOCAINE-MENTHOL 20-0.5 % EX AERO
1.0000 "application " | INHALATION_SPRAY | CUTANEOUS | Status: DC | PRN
  Administered 2015-08-05: 1 via TOPICAL
  Filled 2015-08-05: qty 56

## 2015-08-05 MED ORDER — WITCH HAZEL-GLYCERIN EX PADS: 1.0000 "application " | MEDICATED_PAD | CUTANEOUS | Status: DC | PRN

## 2015-08-05 MED ORDER — COCONUT OIL OIL
1.0000 "application " | TOPICAL_OIL | Status: DC | PRN
  Administered 2015-08-05: 1 via TOPICAL
  Filled 2015-08-05: qty 120

## 2015-08-05 MED ORDER — SENNOSIDES-DOCUSATE SODIUM 8.6-50 MG PO TABS
2.0000 | ORAL_TABLET | ORAL | Status: DC
  Administered 2015-08-05: 2 via ORAL
  Filled 2015-08-05: qty 2

## 2015-08-05 NOTE — Progress Notes (Signed)
Post Partum Day 1 Subjective: No problems or concerns. Patient states doing well. Reports minimal bleeding and pain controlled with pain medications. Denies calf pain.  Bbreastfeeding.    Objective: Blood pressure 131/68, pulse 91, temperature 98.2 F (36.8 C), temperature source Oral, resp. rate 18, height 5\' 6"  (1.676 m), weight 145.151 kg (320 lb), SpO2 96 %, unknown if currently breastfeeding.  Physical Exam:  General: alert, cooperative and appears stated age Lochia: appropriate Uterine Fundus: firm Incision: Ali/a DVT Evaluation: No evidence of DVT seen on physical exam. Negative Homan's sign.   Recent Labs  08/04/15 1020 08/05/15 0629  HGB 13.0 12.5  HCT 38.8 37.6    Assessment/Plan: Plan for discharge tomorrow   LOS: 1 day   Kim Ali 08/05/2015, 9:55 AM

## 2015-08-06 NOTE — Discharge Summary (Signed)
Obstetric Discharge Summary Reason for Admission: onset of labor and rupture of membranes Prenatal Procedures: ultrasound Intrapartum Procedures: spontaneous vaginal delivery and GBS prophylaxis Postpartum Procedures: none Complications-Operative and Postpartum: none HEMOGLOBIN  Date Value Ref Range Status  08/05/2015 12.5 12.0 - 16.0 g/dL Final   HCT  Date Value Ref Range Status  08/05/2015 37.6 35.0 - 47.0 % Final   HEMATOCRIT  Date Value Ref Range Status  07/20/2015 39.2 34.0 - 46.6 % Final    Physical Exam:  General: alert, cooperative, appears older than stated age and morbidly obese Lochia: appropriate Uterine Fundus: firm Incision: NA DVT Evaluation: No evidence of DVT seen on physical exam. Negative Homan's sign.  Discharge Diagnoses: Term Pregnancy-delivered and Premature labor  Discharge Information: Date: 08/06/2015 Activity: pelvic rest Diet: routine Medications: PNV, Ibuprofen and Colace, plans Mirena IUD PP Condition: stable Instructions: refer to practice specific booklet Discharge to: home   Newborn Data: Live born female  Birth Weight: 5 lb 8.9 oz (2520 g) APGAR: 5, 8  Home with mother.  Melody N Shambley 08/06/2015, 4:11 PM   I have reviewed the record and concur with patient management and plan. Corneshia Hines, Daphine DeutscherMARTIN, MD, Evern CoreFACOG

## 2015-08-06 NOTE — Progress Notes (Signed)
Pt states that she received TDaP vaccine during pregnancy.  Pt declines vaccine at this time. Reynold BowenSusan Paisley Rainelle Sulewski, RN 08/06/2015 12:21 PM

## 2015-08-06 NOTE — Progress Notes (Signed)
Discharge instructions provided.  Pt and pt's mother verbalize understanding of all instructions and follow-up care.  Pt discharged to home with infant at 1715 on 08/06/15 via wheelchair by RN. Reynold BowenSusan Paisley Selenia Mihok, RN 08/06/2015 5:26 PM

## 2015-08-06 NOTE — Discharge Instructions (Signed)
Call your doctor for increased pain or vaginal bleeding, temperature above 100.4, depression, or concerns.  No strenuous activity or heavy lifting for 6 weeks.  No intercourse, tampons, douching, or enemas for 6 weeks.  No tub baths-showers only.  No driving for 2 weeks or while taking pain medications.  Continue prenatal vitamin and iron.  Increase calories and fluids while breastfeeding. °

## 2015-08-10 ENCOUNTER — Encounter: Payer: BLUE CROSS/BLUE SHIELD | Admitting: Obstetrics and Gynecology

## 2015-08-17 ENCOUNTER — Encounter: Payer: BLUE CROSS/BLUE SHIELD | Admitting: Obstetrics and Gynecology

## 2015-09-12 ENCOUNTER — Ambulatory Visit (INDEPENDENT_AMBULATORY_CARE_PROVIDER_SITE_OTHER): Payer: BLUE CROSS/BLUE SHIELD | Admitting: Obstetrics and Gynecology

## 2015-09-12 ENCOUNTER — Encounter: Payer: Self-pay | Admitting: Obstetrics and Gynecology

## 2015-09-12 VITALS — BP 120/92 | HR 90 | Ht 66.0 in | Wt 310.6 lb

## 2015-09-12 DIAGNOSIS — Z30430 Encounter for insertion of intrauterine contraceptive device: Secondary | ICD-10-CM

## 2015-09-12 DIAGNOSIS — I10 Essential (primary) hypertension: Secondary | ICD-10-CM | POA: Insufficient documentation

## 2015-09-12 DIAGNOSIS — E668 Other obesity: Secondary | ICD-10-CM | POA: Insufficient documentation

## 2015-09-12 MED ORDER — LEVONORGESTREL 20 MCG/24HR IU IUD: 1.0000 | INTRAUTERINE_SYSTEM | Freq: Once | INTRAUTERINE | 0 refills | Status: DC

## 2015-09-12 NOTE — Patient Instructions (Signed)

## 2015-09-12 NOTE — Progress Notes (Signed)
Kim Ali is a 25 y.o. year old G33P1001 African American female who presents for placement of a Mirena IUD.  No LMP recorded. BP (!) 120/92   Pulse 90   Ht 5\' 6"  (1.676 m)   Wt (!) 310 lb 9.6 oz (140.9 kg)   Breastfeeding? No   BMI 50.13 kg/m  Last sexual intercourse was before delivery of infant, and pregnancy test today was negative  The risks and benefits of the method and placement have been thouroughly reviewed with the patient and all questions were answered.  Specifically the patient is aware of failure rate of 02/998, expulsion of the IUD and of possible perforation.  The patient is aware of irregular bleeding due to the method and understands the incidence of irregular bleeding diminishes with time.  Signed copy of informed consent in chart.   Time out was performed.  A graves speculum was placed in the vagina.  The cervix was visualized, prepped using Betadine, and grasped with a single tooth tenaculum. The uterus was found to be neutral and it sounded to 9 cm.  Mirena IUD placed per manufacturer's recommendations.   The strings were trimmed to 3 cm.  The patient was given post procedure instructions, including signs and symptoms of infection and to check for the strings after each menses or each month, and refraining from intercourse or anything in the vagina for 3 days.  She was given a Mirena care card with date Mirena placed, and date Mirena to be removed.    Makhayla Mcmurry Suzan Nailer, CNM

## 2015-09-19 ENCOUNTER — Ambulatory Visit (INDEPENDENT_AMBULATORY_CARE_PROVIDER_SITE_OTHER): Payer: BLUE CROSS/BLUE SHIELD | Admitting: Obstetrics and Gynecology

## 2015-09-19 ENCOUNTER — Encounter: Payer: Self-pay | Admitting: Obstetrics and Gynecology

## 2015-09-19 DIAGNOSIS — R030 Elevated blood-pressure reading, without diagnosis of hypertension: Secondary | ICD-10-CM

## 2015-09-19 DIAGNOSIS — IMO0001 Reserved for inherently not codable concepts without codable children: Secondary | ICD-10-CM

## 2015-09-19 NOTE — Patient Instructions (Signed)
  Place postpartum visit patient instructions here.  

## 2015-09-19 NOTE — Progress Notes (Signed)
  Subjective:     Kim Ali is a 25 y.o. female who presents for a postpartum visit. She is 6 weeks postpartum following a spontaneous vaginal delivery. I have fully reviewed the prenatal and intrapartum course. The delivery was at 37.5 gestational weeks. Outcome: spontaneous vaginal delivery. Anesthesia: none. Postpartum course has been uncomplicated. Baby's course has been uncomplicated. Baby is feeding by bottle only. Bleeding no bleeding. Bowel function is normal. Bladder function is normal. Patient is not sexually active. Contraception method is IUD. Postpartum depression screening: negative.  The following portions of the patient's history were reviewed and updated as appropriate: allergies, current medications, past family history, past medical history, past social history, past surgical history and problem list.  Review of Systems A comprehensive review of systems was negative.   Objective:    BP (!) 148/110   Pulse 80   Ht 5\' 6"  (1.676 m)   Wt (!) 314 lb 12.8 oz (142.8 kg)   Breastfeeding? No   BMI 50.81 kg/m   General:  alert, cooperative, appears stated age and morbidly obese   Breasts:  positive findings: NA  Lungs: clear to auscultation bilaterally  Heart:  regular rate and rhythm, S1, S2 normal, no murmur, click, rub or gallop  Abdomen: soft, non-tender; bowel sounds normal; no masses,  no organomegaly   Vulva:  normal  Vagina: normal vagina, no discharge, exudate, lesion, or erythema  Cervix:  multiparous appearance and IUD string noted, scant dark brown d/c  Corpus: normal size, contour, position, consistency, mobility, non-tender  Adnexa:  no mass, fullness, tenderness  Rectal Exam: Not performed.        Assessment:     6 weeks postpartum exam. Pap smear not done at today's visit.   Morbid obesity IUD check Elevated BP postpartum  Plan:    1. Contraception: IUD 2. Obesity-referral to lifestyle center 3. Follow up in: 4 months or as needed.    Recommend appointment within the month with PCP for BP evaluation and treatment.

## 2015-09-20 LAB — COMPREHENSIVE METABOLIC PANEL
A/G RATIO: 1.4 (ref 1.2–2.2)
ALBUMIN: 3.9 g/dL (ref 3.5–5.5)
ALK PHOS: 112 IU/L (ref 39–117)
ALT: 13 IU/L (ref 0–32)
AST: 16 IU/L (ref 0–40)
BILIRUBIN TOTAL: 0.5 mg/dL (ref 0.0–1.2)
BUN / CREAT RATIO: 16 (ref 9–23)
BUN: 12 mg/dL (ref 6–20)
CO2: 20 mmol/L (ref 18–29)
Calcium: 9.2 mg/dL (ref 8.7–10.2)
Chloride: 103 mmol/L (ref 96–106)
Creatinine, Ser: 0.74 mg/dL (ref 0.57–1.00)
GFR calc Af Amer: 132 mL/min/{1.73_m2} (ref >59)
GFR calc non Af Amer: 115 mL/min/{1.73_m2} (ref >59)
GLOBULIN, TOTAL: 2.7 g/dL (ref 1.5–4.5)
Glucose: 85 mg/dL (ref 65–99)
POTASSIUM: 4.5 mmol/L (ref 3.5–5.2)
SODIUM: 143 mmol/L (ref 134–144)
Total Protein: 6.6 g/dL (ref 6.0–8.5)

## 2015-09-20 LAB — CBC
HEMATOCRIT: 39.4 % (ref 34.0–46.6)
Hemoglobin: 12.8 g/dL (ref 11.1–15.9)
MCH: 28.9 pg (ref 26.6–33.0)
MCHC: 32.5 g/dL (ref 31.5–35.7)
MCV: 89 fL (ref 79–97)
PLATELETS: 344 10*3/uL (ref 150–379)
RBC: 4.43 x10E6/uL (ref 3.77–5.28)
RDW: 14.4 % (ref 12.3–15.4)
WBC: 7.7 10*3/uL (ref 3.4–10.8)

## 2015-09-20 LAB — THYROID PANEL WITH TSH
Free Thyroxine Index: 2 (ref 1.2–4.9)
T3 UPTAKE RATIO: 27 % (ref 24–39)
T4 TOTAL: 7.5 ug/dL (ref 4.5–12.0)
TSH: 1.73 u[IU]/mL (ref 0.450–4.500)

## 2015-09-20 LAB — VITAMIN D 25 HYDROXY (VIT D DEFICIENCY, FRACTURES): Vit D, 25-Hydroxy: 19.6 ng/mL — ABNORMAL LOW (ref 30.0–100.0)

## 2015-09-20 LAB — HEMOGLOBIN A1C
ESTIMATED AVERAGE GLUCOSE: 105 mg/dL
HEMOGLOBIN A1C: 5.3 % (ref 4.8–5.6)

## 2015-09-20 LAB — IRON: Iron: 59 ug/dL (ref 27–159)

## 2015-09-27 ENCOUNTER — Other Ambulatory Visit: Payer: Self-pay | Admitting: Obstetrics and Gynecology

## 2015-09-27 DIAGNOSIS — E559 Vitamin D deficiency, unspecified: Secondary | ICD-10-CM | POA: Insufficient documentation

## 2015-09-27 MED ORDER — VITAMIN D (ERGOCALCIFEROL) 1.25 MG (50000 UNIT) PO CAPS: 50000.0000 [IU] | ORAL_CAPSULE | ORAL | 2 refills | Status: DC

## 2015-09-28 ENCOUNTER — Telehealth: Payer: Self-pay | Admitting: *Deleted

## 2015-09-28 NOTE — Telephone Encounter (Signed)
Notified pt of results mailed Vit D info to pt

## 2015-09-28 NOTE — Telephone Encounter (Signed)
-----   Message from Purcell NailsMelody N Shambley, [place] sent at 09/27/2015  4:56 PM EDT ----- Please let her know all labs were good except vit d is low- I sent in rx for twice weekly supplement and want to recheck levels in 4 months. Please mail info on vit D def.

## 2015-10-16 ENCOUNTER — Ambulatory Visit: Payer: BLUE CROSS/BLUE SHIELD | Admitting: Dietician

## 2015-10-19 ENCOUNTER — Ambulatory Visit: Payer: BLUE CROSS/BLUE SHIELD | Admitting: Dietician

## 2016-01-25 ENCOUNTER — Other Ambulatory Visit: Payer: Self-pay | Admitting: Obstetrics and Gynecology

## 2016-01-25 ENCOUNTER — Ambulatory Visit (INDEPENDENT_AMBULATORY_CARE_PROVIDER_SITE_OTHER): Payer: BLUE CROSS/BLUE SHIELD | Admitting: Obstetrics and Gynecology

## 2016-01-25 ENCOUNTER — Encounter: Payer: Self-pay | Admitting: Obstetrics and Gynecology

## 2016-01-25 VITALS — BP 125/88 | HR 86 | Ht 66.0 in | Wt 326.5 lb

## 2016-01-25 DIAGNOSIS — Z30431 Encounter for routine checking of intrauterine contraceptive device: Secondary | ICD-10-CM | POA: Diagnosis not present

## 2016-01-25 DIAGNOSIS — Z23 Encounter for immunization: Secondary | ICD-10-CM

## 2016-01-25 DIAGNOSIS — Z01419 Encounter for gynecological examination (general) (routine) without abnormal findings: Secondary | ICD-10-CM | POA: Diagnosis not present

## 2016-01-25 NOTE — Progress Notes (Signed)
  Subjective:     Kim Ali is a 25 y.o. female and is here for a comprehensive physical exam. The patient reports no problems.  Social History   Social History  . Marital status: Single    Spouse name: N/A  . Number of children: N/A  . Years of education: N/A   Occupational History  . Not on file.   Social History Main Topics  . Smoking status: Never Smoker  . Smokeless tobacco: Never Used  . Alcohol use 0.0 oz/week  . Drug use: No  . Sexual activity: Yes    Partners: Male    Birth control/ protection: None, IUD     Comment: mirena   Other Topics Concern  . Not on file   Social History Narrative  . No narrative on file   Health Maintenance  Topic Date Due  . PAP SMEAR  12/16/2012  . INFLUENZA VACCINE  09/19/2015  . TETANUS/TDAP  07/25/2025  . HIV Screening  Completed    The following portions of the patient's history were reviewed and updated as appropriate: allergies, current medications, past family history, past medical history, past social history, past surgical history and problem list.  Review of Systems A comprehensive review of systems was negative.   Objective:    General appearance: alert, cooperative and appears stated age Neck: no adenopathy, no carotid bruit, no JVD, supple, symmetrical, trachea midline and thyroid not enlarged, symmetric, no tenderness/mass/nodules Lungs: clear to auscultation bilaterally Breasts: normal appearance, no masses or tenderness Heart: regular rate and rhythm, S1, S2 normal, no murmur, click, rub or gallop Abdomen: soft, non-tender; bowel sounds normal; no masses,  no organomegaly    Assessment:    Healthy female exam. IUD check; needs flu vaccine, morbid obesity     Plan:  Flu vaccine given Happy with IUD RTC 1 year or as needed  Carynn Felling Aura CampsShambley, CNM   See After Visit Summary for Counseling Recommendations

## 2016-01-25 NOTE — Patient Instructions (Signed)
 Preventive Care 18-39 Years, Female Preventive care refers to lifestyle choices and visits with your health care provider that can promote health and wellness. What does preventive care include?  A yearly physical exam. This is also called an annual well check.  Dental exams once or twice a year.  Routine eye exams. Ask your health care provider how often you should have your eyes checked.  Personal lifestyle choices, including:  Daily care of your teeth and gums.  Regular physical activity.  Eating a healthy diet.  Avoiding tobacco and drug use.  Limiting alcohol use.  Practicing safe sex.  Taking vitamin and mineral supplements as recommended by your health care provider. What happens during an annual well check? The services and screenings done by your health care provider during your annual well check will depend on your age, overall health, lifestyle risk factors, and family history of disease. Counseling  Your health care provider may ask you questions about your:  Alcohol use.  Tobacco use.  Drug use.  Emotional well-being.  Home and relationship well-being.  Sexual activity.  Eating habits.  Work and work environment.  Method of birth control.  Menstrual cycle.  Pregnancy history. Screening  You may have the following tests or measurements:  Height, weight, and BMI.  Diabetes screening. This is done by checking your blood sugar (glucose) after you have not eaten for a while (fasting).  Blood pressure.  Lipid and cholesterol levels. These may be checked every 5 years starting at age 20.  Skin check.  Hepatitis C blood test.  Hepatitis B blood test.  Sexually transmitted disease (STD) testing.  BRCA-related cancer screening. This may be done if you have a family history of breast, ovarian, tubal, or peritoneal cancers.  Pelvic exam and Pap test. This may be done every 3 years starting at age 21. Starting at age 30, this may be done  every 5 years if you have a Pap test in combination with an HPV test. Discuss your test results, treatment options, and if necessary, the need for more tests with your health care provider. Vaccines  Your health care provider may recommend certain vaccines, such as:  Influenza vaccine. This is recommended every year.  Tetanus, diphtheria, and acellular pertussis (Tdap, Td) vaccine. You may need a Td booster every 10 years.  Varicella vaccine. You may need this if you have not been vaccinated.  HPV vaccine. If you are 26 or younger, you may need three doses over 6 months.  Measles, mumps, and rubella (MMR) vaccine. You may need at least one dose of MMR. You may also need a second dose.  Pneumococcal 13-valent conjugate (PCV13) vaccine. You may need this if you have certain conditions and were not previously vaccinated.  Pneumococcal polysaccharide (PPSV23) vaccine. You may need one or two doses if you smoke cigarettes or if you have certain conditions.  Meningococcal vaccine. One dose is recommended if you are age 19-21 years and a first-year college student living in a residence hall, or if you have one of several medical conditions. You may also need additional booster doses.  Hepatitis A vaccine. You may need this if you have certain conditions or if you travel or work in places where you may be exposed to hepatitis A.  Hepatitis B vaccine. You may need this if you have certain conditions or if you travel or work in places where you may be exposed to hepatitis B.  Haemophilus influenzae type b (Hib) vaccine. You may need   this if you have certain risk factors. Talk to your health care provider about which screenings and vaccines you need and how often you need them. This information is not intended to replace advice given to you by your health care provider. Make sure you discuss any questions you have with your health care provider. Document Released: 04/02/2001 Document Revised:  10/25/2015 Document Reviewed: 12/06/2014 Elsevier Interactive Patient Education  2017 Elsevier Inc.  

## 2016-01-29 LAB — CYTOLOGY - PAP

## 2016-03-08 ENCOUNTER — Telehealth: Payer: Self-pay | Admitting: *Deleted

## 2016-03-08 NOTE — Telephone Encounter (Signed)
-----   Message from Purcell NailsMelody N Shambley, [place] sent at 02/13/2016  9:55 AM EST ----- Please let her know pap was ASCUS, HPV+, and she needs a colpo if she hasn't had one within a year, please give info sheet on her specific results.

## 2016-03-08 NOTE — Telephone Encounter (Signed)
Mailed pt all info about results

## 2016-04-10 ENCOUNTER — Encounter: Payer: Self-pay | Admitting: Obstetrics and Gynecology

## 2016-04-10 ENCOUNTER — Other Ambulatory Visit: Payer: Self-pay | Admitting: Obstetrics and Gynecology

## 2016-04-10 ENCOUNTER — Ambulatory Visit (INDEPENDENT_AMBULATORY_CARE_PROVIDER_SITE_OTHER): Payer: BLUE CROSS/BLUE SHIELD | Admitting: Obstetrics and Gynecology

## 2016-04-10 VITALS — BP 115/83 | HR 79 | Ht 66.0 in | Wt 329.6 lb

## 2016-04-10 DIAGNOSIS — R87810 Cervical high risk human papillomavirus (HPV) DNA test positive: Secondary | ICD-10-CM

## 2016-04-10 DIAGNOSIS — R87610 Atypical squamous cells of undetermined significance on cytologic smear of cervix (ASC-US): Secondary | ICD-10-CM | POA: Diagnosis not present

## 2016-04-10 NOTE — Progress Notes (Signed)
[facility]PASS COLPOSCOPY PROCEDURE NOTE  26 y.o. G1P1001 here for colposcopy for ASCUS with POSITIVE high risk HPV pap smear on 01/28/16. Discussed role for HPV in cervical dysplasia, need for surveillance.  Patient given informed consent, signed copy in the chart, time out was performed.  Placed in lithotomy position. Cervix viewed with speculum and colposcope after application of acetic acid.   Colposcopy adequate? Yes  no visible lesions.  ECC specimen obtained. IUD string noted and no disruption noted. All specimens were labelled and sent to pathology. See scanned colpo sheet with detailed drawing.   Patient was given post procedure instructions.  Will follow up pathology and manage accordingly.  Routine preventative health maintenance measures emphasized.     Yuka Lallier Suzan NailerN Bethanny Toelle, CNM

## 2016-04-16 ENCOUNTER — Telehealth: Payer: Self-pay | Admitting: *Deleted

## 2016-04-16 NOTE — Telephone Encounter (Signed)
Notified pt of results 

## 2016-04-16 NOTE — Telephone Encounter (Signed)
-----   Message from Purcell NailsMelody N Shambley, [place] sent at 04/16/2016  9:58 AM EST ----- Please let her know biopsy was negative. No worries, I still want to see her in 6 months for repeat pap.

## 2016-06-28 IMAGING — CR DG FOOT COMPLETE 3+V*L*
3 series · 3 of 3 positions shown · non-contrast
Comparison: None

CLINICAL DATA: Pain and swelling over LEFT lateral malleolus and
base of fifth metatarsal after stepping up fell onto low stool at
twisting this morning at work

EXAM:
LEFT FOOT - COMPLETE 3+ VIEW

[foot ap]
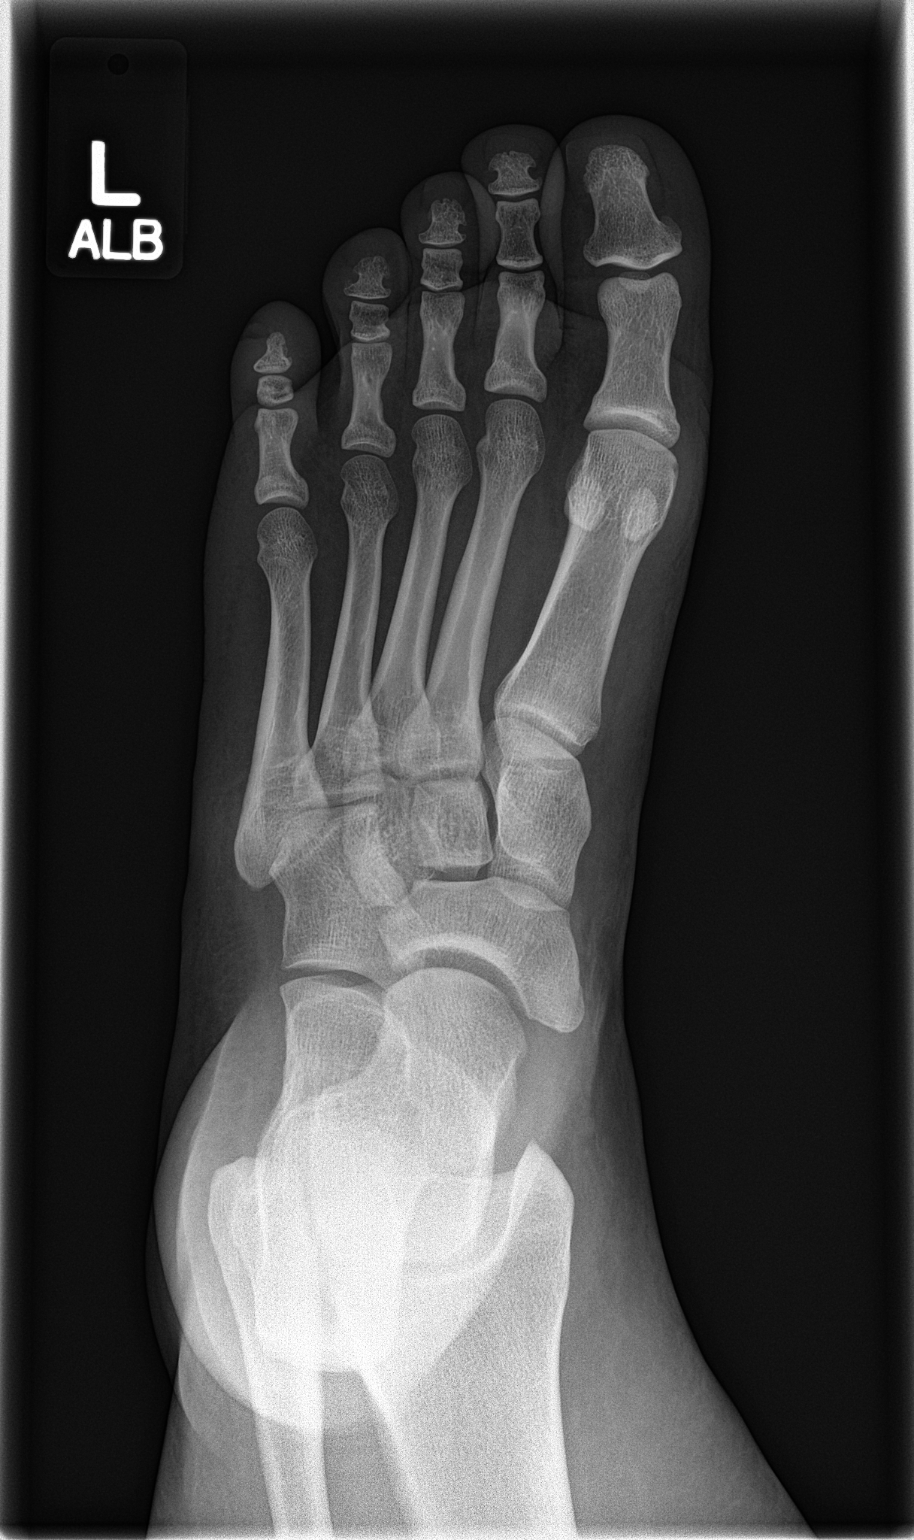

[foot obl]
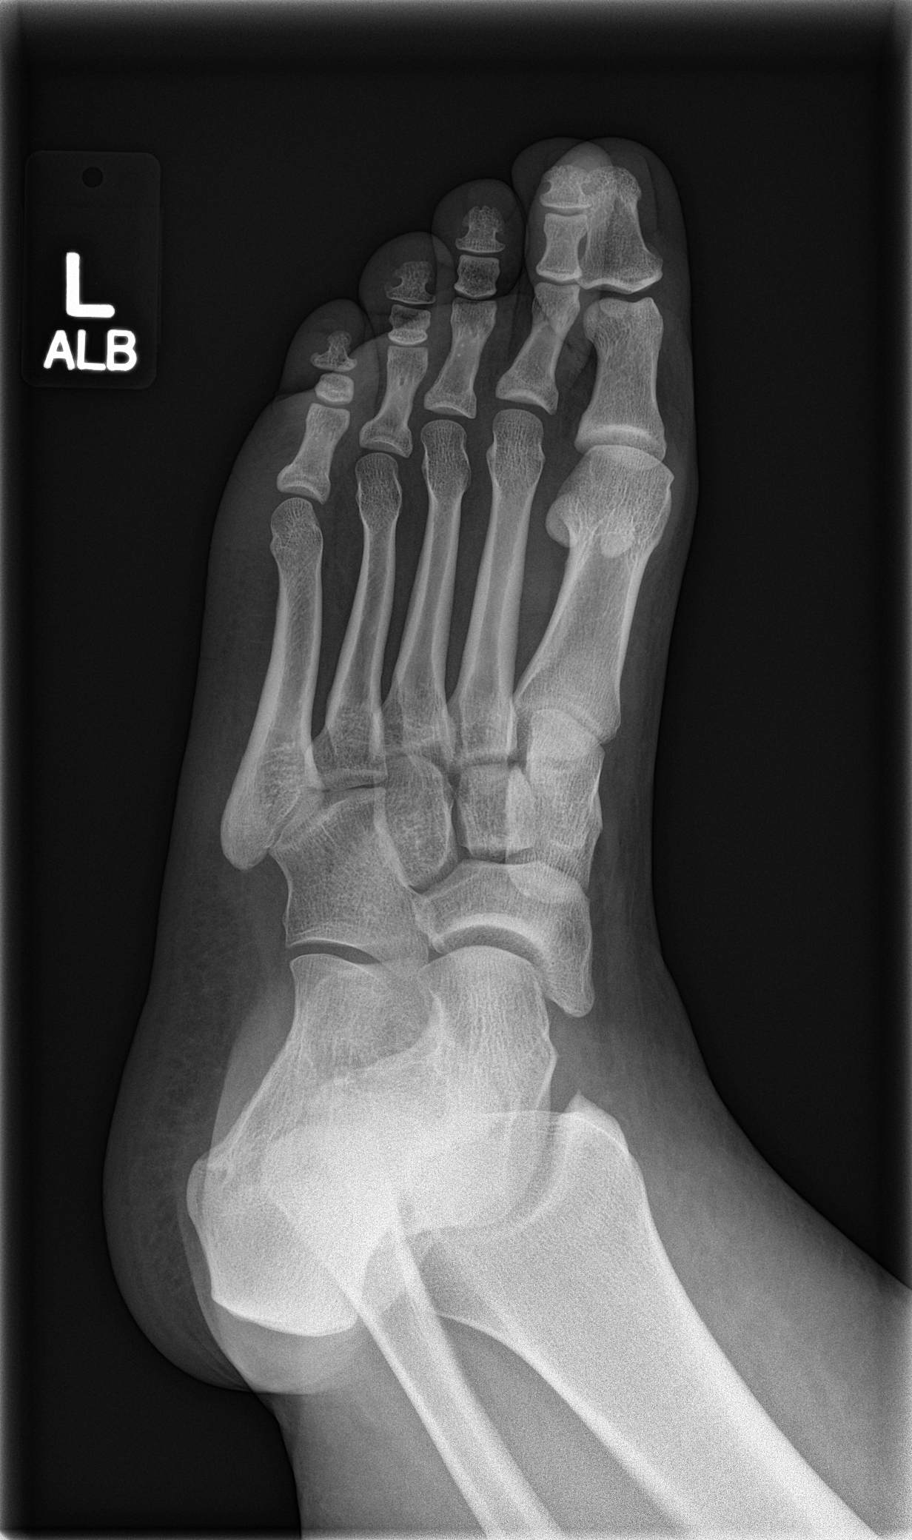

[foot lat]
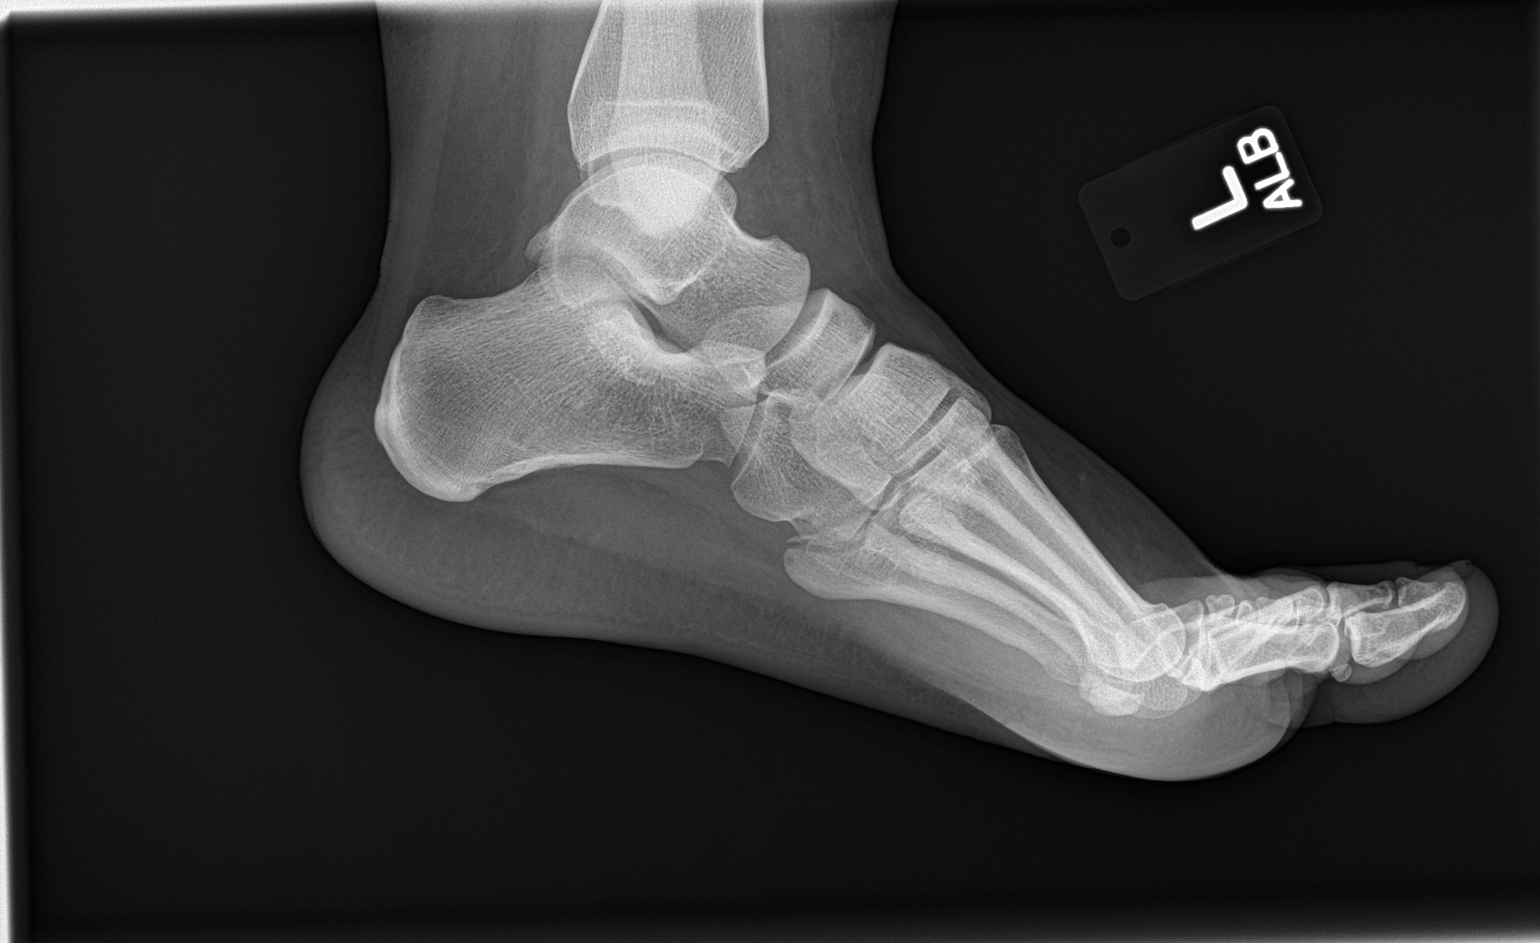

[3 of 3 positions shown; findings below may reference images not displayed]

FINDINGS: Osseous mineralization normal.

Joint spaces preserved.

Avulsion fracture at base of fifth metatarsal seen only on lateral
view.

No additional fracture, dislocation or bone destruction.
IMPRESSION: Avulsion fracture at base of LEFT fifth metatarsal seen only on
lateral view.

## 2016-08-08 ENCOUNTER — Encounter: Payer: BLUE CROSS/BLUE SHIELD | Admitting: Obstetrics and Gynecology

## 2016-10-09 ENCOUNTER — Encounter: Payer: BLUE CROSS/BLUE SHIELD | Admitting: Obstetrics and Gynecology

## 2016-10-24 ENCOUNTER — Other Ambulatory Visit: Payer: Self-pay | Admitting: Obstetrics and Gynecology

## 2016-10-24 ENCOUNTER — Ambulatory Visit (INDEPENDENT_AMBULATORY_CARE_PROVIDER_SITE_OTHER): Payer: BLUE CROSS/BLUE SHIELD | Admitting: Obstetrics and Gynecology

## 2016-10-24 VITALS — BP 105/84 | HR 87 | Ht 66.0 in | Wt 340.1 lb

## 2016-10-24 DIAGNOSIS — R87810 Cervical high risk human papillomavirus (HPV) DNA test positive: Secondary | ICD-10-CM | POA: Diagnosis not present

## 2016-10-24 DIAGNOSIS — A599 Trichomoniasis, unspecified: Secondary | ICD-10-CM

## 2016-10-24 DIAGNOSIS — R87610 Atypical squamous cells of undetermined significance on cytologic smear of cervix (ASC-US): Secondary | ICD-10-CM | POA: Diagnosis not present

## 2016-10-24 MED ORDER — METRONIDAZOLE 500 MG PO TABS
500.0000 mg | ORAL_TABLET | Freq: Two times a day (BID) | ORAL | 1 refills | Status: DC
Start: 2016-10-24 — End: 2016-11-29

## 2016-10-24 NOTE — Progress Notes (Signed)
Subjective:     Patient ID: Shelbie Proctorkeyah Lashawn Arneson, female   DOB: [birth year], 26 y.o.   MRN: [id number]  HPI Here for repeat pap, last one 01/2016 ASCUS, HPV+. Denies any concerns or new sexual partners.   Review of Systems Negative     Objective:   Physical Exam A&Ox4 Well groomed female in no distress Blood pressure 105/84, pulse 87, height 5\' 6"  (1.676 m), weight (!) 340 lb 1.6 oz (154.3 kg), not currently breastfeeding. Pelvic exam: normal external genitalia, vulva, vagina, cervix, uterus and adnexa, CERVIX: normal appearing cervix without discharge or lesions, cervical discharge present - copious and green, IUD string noted, WET MOUNT done - results: trichomonads, white blood cells.    Assessment:     Repeat pap IUD check trichomonias    Plan:     Counseled on findings. Flagyl rx sent in with refill for partner. RTC 1 month for TOC, then 6 months for physical.  Harlow MaresMelody Shambley, CNM

## 2016-10-25 LAB — CYTOLOGY - PAP

## 2016-10-30 ENCOUNTER — Encounter: Payer: Self-pay | Admitting: Emergency Medicine

## 2016-10-30 DIAGNOSIS — M79621 Pain in right upper arm: Secondary | ICD-10-CM | POA: Diagnosis present

## 2016-10-30 DIAGNOSIS — L02411 Cutaneous abscess of right axilla: Secondary | ICD-10-CM | POA: Insufficient documentation

## 2016-10-30 NOTE — ED Triage Notes (Signed)
Patient with abscess under right arm that she noticed on Sunday. Patient reports that it has become larger.

## 2016-10-31 ENCOUNTER — Emergency Department
Admission: EM | Admit: 2016-10-31 | Discharge: 2016-10-31 | Disposition: A | Discharge status: Home / Self Care | Payer: BLUE CROSS/BLUE SHIELD | Attending: Emergency Medicine | Admitting: Emergency Medicine

## 2016-10-31 DIAGNOSIS — L02411 Cutaneous abscess of right axilla: Secondary | ICD-10-CM

## 2016-10-31 MED ORDER — IBUPROFEN 800 MG PO TABS: 800.0000 mg | ORAL_TABLET | Freq: Three times a day (TID) | ORAL | 0 refills | Status: DC | PRN

## 2016-10-31 MED ORDER — HYDROCODONE-ACETAMINOPHEN 5-325 MG PO TABS: 1.0000 | ORAL_TABLET | Freq: Four times a day (QID) | ORAL | 0 refills | Status: DC | PRN

## 2016-10-31 MED ORDER — SULFAMETHOXAZOLE-TRIMETHOPRIM 800-160 MG PO TABS: 2.0000 | ORAL_TABLET | Freq: Two times a day (BID) | ORAL | 0 refills | Status: DC

## 2016-10-31 MED ORDER — HYDROCODONE-ACETAMINOPHEN 5-325 MG PO TABS
1.0000 | ORAL_TABLET | Freq: Once | ORAL | Status: AC
  Administered 2016-10-31: 1 via ORAL
  Filled 2016-10-31: qty 1

## 2016-10-31 MED ORDER — SULFAMETHOXAZOLE-TRIMETHOPRIM 800-160 MG PO TABS
2.0000 | ORAL_TABLET | Freq: Once | ORAL | Status: AC
  Administered 2016-10-31: 2 via ORAL
  Filled 2016-10-31: qty 2

## 2016-10-31 MED ORDER — IBUPROFEN 800 MG PO TABS
800.0000 mg | ORAL_TABLET | Freq: Once | ORAL | Status: AC
  Administered 2016-10-31: 800 mg via ORAL
  Filled 2016-10-31: qty 1

## 2016-10-31 NOTE — Discharge Instructions (Signed)
1. Take antibiotic as prescribed (Bactrim DS 2 tablets twice daily 10 days). 2. You may take pain medicines as needed (Motrin/Norco #15). 3. Return to the ER for worsening symptoms, increased redness/swelling, fever, vomiting or other concerns.

## 2016-10-31 NOTE — ED Provider Notes (Signed)
[facility] Emergency Department Provider Note   ____________________________________________   First MD Initiated Contact with Patient 10/31/16 0210     (approximate)  I have reviewed the triage vital signs and the nursing notes.   HISTORY  Chief Complaint Abscess    HPI Kim Ali is a 26 y.o. female who presents to the ED from home with a chief complaint of abscess. Patient notes abscess under right axilla x one week. Denies drainage. Denies fever, chills, chest pain, shortness of breath, abdominal pain, nausea, vomiting.   Past Medical History:  Diagnosis Date  . Morbid obesity with BMI of 45.0-49.9, adult [facility])     Patient Active Problem List   Diagnosis Date Noted  . Vitamin D deficiency 09/27/2015  . Extreme obesity 09/12/2015  . Morbid obesity with BMI of 45.0-49.9, adult (HCC) 07/23/2015    Past Surgical History:  Procedure Laterality Date  . NO PAST SURGERIES      Prior to Admission medications   Medication Sig Start Date End Date Taking? Authorizing Provider  HYDROcodone-acetaminophen (NORCO) 5-325 MG tablet Take 1 tablet by mouth every 6 (six) hours as needed for moderate pain. 10/31/16   Irean Hong, MD  ibuprofen (ADVIL,MOTRIN) 800 MG tablet Take 1 tablet (800 mg total) by mouth every 8 (eight) hours as needed for moderate pain. 10/31/16   Irean Hong, MD  levonorgestrel (MIRENA) 20 MCG/24HR IUD 1 each by Intrauterine route once.    [provider]  metroNIDAZOLE (FLAGYL) 500 MG tablet Take 1 tablet (500 mg total) by mouth 2 (two) times daily. 10/24/16   Shambley, Melody N, CNM  sulfamethoxazole-trimethoprim (BACTRIM DS,SEPTRA DS) 800-160 MG tablet Take 2 tablets by mouth 2 (two) times daily. 10/31/16   Irean Hong, MD  Vitamin D, Ergocalciferol, (DRISDOL) 50000 units CAPS capsule Take 1 capsule (50,000 Units total) by mouth 2 (two) times a week. Patient not taking: Reported on 01/25/2016 09/28/15   Purcell Nails, CNM    Allergies Penicillin g  Family History  Problem Relation Age of Onset  . Diabetes Maternal Uncle   . Diabetes Paternal Uncle   . Breast cancer Maternal Grandmother        grt  . Diabetes Paternal Grandfather   . Ovarian cancer Neg Hx   . Colon cancer Neg Hx   . Heart disease Neg Hx     Social History Social History  Substance Use Topics  . Smoking status: Never Smoker  . Smokeless tobacco: Never Used  . Alcohol use 0.0 oz/week     Comment: rarely    Review of Systems  Constitutional: No fever/chills. Eyes: No visual changes. ENT: No sore throat. Cardiovascular: Denies chest pain. Respiratory: Denies shortness of breath. Gastrointestinal: No abdominal pain.  No nausea, no vomiting.  No diarrhea.  No constipation. Genitourinary: Negative for dysuria. Musculoskeletal: Negative for back pain. Skin: positive for abscess to right axilla. Negative for rash. Neurological: Negative for headaches, focal weakness or numbness.   ____________________________________________   PHYSICAL EXAM:  VITAL SIGNS: ED Triage Vitals  Enc Vitals Group     BP 10/30/16 2240 (!) 149/97     Pulse Rate 10/30/16 2240 100     Resp 10/30/16 2240 18     Temp 10/30/16 2240 98.2 F (36.8 C)     Temp Source 10/30/16 2240 Oral     SpO2 10/30/16 2240 100 %     Weight 10/30/16 2241 (!) 340 lb (154.2 kg)  Height 10/30/16 2241 5\' 5"  (1.651 m)     Head Circumference --      Peak Flow --      Pain Score 10/30/16 2240 7     Pain Loc --      Pain Edu? --      Excl. in GC? --     Constitutional: Alert and oriented. Well appearing and in no acute distress. Eyes: Conjunctivae are normal. PERRL. EOMI. Head: Atraumatic. Nose: No congestion/rhinnorhea. Mouth/Throat: Mucous membranes are moist.  Oropharynx non-erythematous. Neck: No stridor.   Cardiovascular: Normal rate, regular rhythm. Grossly normal heart sounds.  Good peripheral circulation. Respiratory: Normal respiratory  effort.  No retractions. Lungs CTAB. Gastrointestinal: Soft and nontender. No distention. No abdominal bruits. No CVA tenderness. Musculoskeletal: No lower extremity tenderness nor edema.  No joint effusions. Neurologic:  Normal speech and language. No gross focal neurologic deficits are appreciated. No gait instability. Skin:  Small abscess noted to right axilla. Area is firm and indurated; no fluctuance palpated. There is no surrounding warmth or erythema. Skin is warm, dry and intact. No rash noted. Psychiatric: Mood and affect are normal. Speech and behavior are normal.  ____________________________________________   LABS (all labs ordered are listed, but only abnormal results are displayed)  Labs Reviewed - No data to display ____________________________________________  EKG  none ____________________________________________  RADIOLOGY  No results found.  ____________________________________________   PROCEDURES  Procedure(s) performed: None  Procedures  Critical Care performed: No  ____________________________________________   INITIAL IMPRESSION / ASSESSMENT AND PLAN / ED COURSE  Pertinent labs & imaging results that were available during my care of the patient were reviewed by me and considered in my medical decision making (see chart for details).  26 year old female who presents with abscess to right axilla. Area is mostly indurated without fluctuance; there is no surrounding warmth or erythema. We discussed options of trying I&D tonight ( may or may not get any pus out) versus conservative treatment with antibiotics and warm compresses. Patient prefers no I&D tonight. We did discuss that on the antibiotics, she could recover fully, area could open up and drain on its own, or area could get worse and she would need to come back for I&D. Strict return precautions given. Patient and mother verbalize understanding and agree with plan of care.       ____________________________________________   FINAL CLINICAL IMPRESSION(S) / ED DIAGNOSES  Final diagnoses:  Abscess of axilla, right      NEW MEDICATIONS STARTED DURING THIS VISIT:  New Prescriptions   HYDROCODONE-ACETAMINOPHEN (NORCO) 5-325 MG TABLET    Take 1 tablet by mouth every 6 (six) hours as needed for moderate pain.   IBUPROFEN (ADVIL,MOTRIN) 800 MG TABLET    Take 1 tablet (800 mg total) by mouth every 8 (eight) hours as needed for moderate pain.   SULFAMETHOXAZOLE-TRIMETHOPRIM (BACTRIM DS,SEPTRA DS) 800-160 MG TABLET    Take 2 tablets by mouth 2 (two) times daily.     Note:  This document was prepared using Dragon voice recognition software and may include unintentional dictation errors.    Irean HongSung, Kyle Stansell J, MD 10/31/16 [phone]

## 2016-11-21 ENCOUNTER — Encounter: Payer: BLUE CROSS/BLUE SHIELD | Admitting: Obstetrics and Gynecology

## 2016-11-29 ENCOUNTER — Encounter: Payer: Self-pay | Admitting: Obstetrics and Gynecology

## 2016-11-29 ENCOUNTER — Ambulatory Visit (INDEPENDENT_AMBULATORY_CARE_PROVIDER_SITE_OTHER): Payer: BLUE CROSS/BLUE SHIELD | Admitting: Obstetrics and Gynecology

## 2016-11-29 VITALS — BP 133/102 | HR 79 | Wt 340.0 lb

## 2016-11-29 DIAGNOSIS — A5901 Trichomonal vulvovaginitis: Secondary | ICD-10-CM | POA: Diagnosis not present

## 2016-11-29 MED ORDER — METRONIDAZOLE 500 MG PO TABS: 500.0000 mg | ORAL_TABLET | Freq: Three times a day (TID) | ORAL | 1 refills | Status: DC

## 2016-11-29 NOTE — Progress Notes (Signed)
Subjective:     Patient ID: Shelbie Proctor, female   DOB: February 09, 1991, 26 y.o.   MRN: [id number]  HPI Here for TOC due to +trich on pap last month. Denies any concerns. States she took all 5 days of medication and her partner did the same.   Review of Systems Negative except stated above    Objective:   Physical Exam A&Ox4 Well groomed female in no distress Blood pressure (!) 133/102, pulse 79, weight (!) 340 lb (154.2 kg), not currently breastfeeding. Pelvic exam: normal external genitalia, vulva, vagina, cervix, uterus and adnexa, WET MOUNT done - results: trichomonads.    Assessment:     Trichomonal vaginitis    Plan:     Will take another round of flagyl for 7 days and recheck in 4 weeks.  Bohdan Macho,CNM

## 2016-12-27 ENCOUNTER — Encounter: Payer: BLUE CROSS/BLUE SHIELD | Admitting: Obstetrics and Gynecology

## 2017-01-18 DIAGNOSIS — A599 Trichomoniasis, unspecified: Secondary | ICD-10-CM

## 2017-01-18 HISTORY — DX: Trichomoniasis, unspecified: A59.9

## 2017-04-23 ENCOUNTER — Encounter: Payer: BLUE CROSS/BLUE SHIELD | Admitting: Obstetrics and Gynecology

## 2018-11-25 ENCOUNTER — Other Ambulatory Visit: Payer: Self-pay

## 2018-11-25 DIAGNOSIS — Z20822 Contact with and (suspected) exposure to covid-19: Secondary | ICD-10-CM

## 2018-11-27 LAB — NOVEL CORONAVIRUS, NAA: SARS-CoV-2, NAA: NOT DETECTED

## 2019-01-21 ENCOUNTER — Other Ambulatory Visit: Payer: Self-pay

## 2019-01-21 DIAGNOSIS — Z20822 Contact with and (suspected) exposure to covid-19: Secondary | ICD-10-CM

## 2019-01-24 ENCOUNTER — Telehealth: Payer: Self-pay

## 2019-01-24 LAB — NOVEL CORONAVIRUS, NAA: SARS-CoV-2, NAA: DETECTED — AB

## 2019-01-24 NOTE — Telephone Encounter (Signed)
Pt called for test results advised that results are not back 

## 2020-06-05 ENCOUNTER — Encounter: Payer: Self-pay | Admitting: Certified Nurse Midwife

## 2020-06-05 ENCOUNTER — Ambulatory Visit (INDEPENDENT_AMBULATORY_CARE_PROVIDER_SITE_OTHER): Payer: BC Managed Care – PPO | Admitting: Certified Nurse Midwife

## 2020-06-05 ENCOUNTER — Other Ambulatory Visit: Payer: Self-pay

## 2020-06-05 VITALS — BP 156/94 | HR 93 | Ht 67.0 in | Wt 386.5 lb

## 2020-06-05 DIAGNOSIS — Z30432 Encounter for removal of intrauterine contraceptive device: Secondary | ICD-10-CM | POA: Diagnosis not present

## 2020-06-05 NOTE — Patient Instructions (Signed)
Preparing for Pregnancy If you are planning to become pregnant, talk to your health care provider about preconception care. This type of care helps you prepare for a safe and healthy pregnancy. During this visit, your health care provider will:  Do a complete physical exam, including a Pap test.  Take your complete medical history.  Give you information, answer your questions, and help you resolve problems. Preconception checklist Medical history  Tell your health care provider about any medical conditions you have or have had. Your pregnancy or your ability to become pregnant may be affected by long-term (chronic) conditions, such as: ? Diabetes. ? High blood pressure (hypertension). ? Thyroid problems.  Tell your health care provider about your family's medical history and your partner's medical history.   Tell your health care provider if you have or have had any sexually transmitted infections, or STIs. These can affect your pregnancy. In some cases, they can be passed to your baby.  If needed, discuss the benefits of genetic testing. This test checks for conditions that may be passed from parent to child.  Tell your health care provider about: ? Any problems you had getting pregnant or while pregnant. ? Any medicines you take. These include vitamins, herbal supplements, and over-the-counter medicines. ? Your history of getting vaccines. Discuss any vaccines that you may need. Diet  Ask your health care provider about what foods to eat in order to get a balance of nutrients. This is especially important when you are pregnant or preparing to become pregnant. It is recommended that women of childbearing age take a folic acid supplement of 400 mcg daily and eat foods rich in folic acid to prevent certain birth defects.  Ask your health care provider to help you reach a healthy weight before pregnancy. ? If you are overweight, you may have a higher risk for certain problems. These  include hypertension, diabetes, and early (preterm) birth. ? If you are underweight, you are more likely to have a baby who has a low birth weight. Lifestyle, work, and home Let your health care provider know about:  Any lifestyle habits that you have, such as use of alcohol, drugs, or tobacco products.  Fun and leisure activities that may put you at risk during pregnancy, such as downhill skiing and certain exercise programs.  Any plans to travel out of the country, especially to places with an active Zika virus outbreak.  Harmful substances that you may be exposed to at work or at home. These include chemicals, pesticides, radiation, and substances from cat litter boxes.  Any concerns you have for your safety at home. Mental health Tell your health care provider about:  Any history of mental health conditions, including feelings of depression, sadness, or anxiety.  Any medicines that you take for a mental health condition. These include herbs and supplements. How do I know that I am pregnant? You may be pregnant if you have been sexually active and you miss your period. Other symptoms of early pregnancy include:  Mild cramping.  Very light vaginal bleeding (spotting).  Feeling more tired than usual.  Nausea and vomiting. These may be signs of morning sickness. Take a home pregnancy test if you have any of these symptoms. This test checks for a hormone in your urine called human chorionic gonadotropin, or hCG. A woman's body begins to make this hormone during early pregnancy. These tests are very accurate. Wait until at least the first day after you miss your period to take a   home pregnancy test. If the test shows that you are pregnant, call your health care provider for a prenatal care visit. What should I do if I become pregnant?  Schedule a visit with your health care provider as soon as you suspect you are pregnant.  Talk to your health care provider if you are taking  prescription medicines to determine if they are safe to take during pregnancy.  You may continue to have sex if it does not cause pain or other problems, such as vaginal bleeding. Follow these instructions at home: Eating and drinking  Follow instructions from your health care provider about eating or drinking restrictions.  Drink enough fluid to keep your urine pale yellow.  Eat a balanced diet. This includes fresh fruits and vegetables, whole grains, lean meats, low-fat dairy products, healthy fats, and foods that are high in fiber. Ask to meet with a nutritionist or registered dietitian for help with meal planning and goals.  Avoid eating raw or undercooked meat and seafood.  Avoid eating or drinking unpasteurized dairy products.   Lifestyle  Get regular exercise. Try to be active for at least 30 minutes a day on most days of the week. Ask your health care provider which activities are safe during pregnancy.  Maintain a healthy weight.  Avoid toxic fumes and chemicals.  Avoid cleaning cat litter boxes. Cat feces may contain a harmful parasite called toxoplasma.  Avoid travel to countries where Zika virus is common.  Do not use any products that contain nicotine or tobacco, such as cigarettes, e-cigarettes, and chewing tobacco. If you need help quitting, ask your health care provider.  Do not drink alcohol or use drugs.      General instructions  Keep an accurate record of your menstrual periods. This makes it easier for your health care provider to determine your baby's due date.  Take over-the-counter and prescription medicines only as told by your health care provider.  Begin taking prenatal vitamins and folic acid supplements daily as directed.  Manage any chronic conditions, such as hypertension and diabetes, as told by your health care provider. This is important. Summary  If you are planning to become pregnant, talk to your health care provider about preconception  care. This is an important part of planning for a healthy pregnancy.  Women of childbearing age should take 400 mcg of folic acid daily in addition to eating a diet rich in folic acid. This will prevent certain birth defects.  Schedule a visit with your health care provider as soon as you suspect you are pregnant. Tell your health care provider about your medical history, lifestyle activities, home safety, and other things that may concern you. This information is not intended to replace advice given to you by your health care provider. Make sure you discuss any questions you have with your health care provider. Document Revised: 11/04/2018 Document Reviewed: 11/04/2018 Elsevier Patient Education  2021 Elsevier Inc.  

## 2020-06-05 NOTE — Progress Notes (Signed)
GYN-PT present for IUD removal. Pt had IUD inserted in 2017 after birth of child. Pt is requested for the IUD removed and not replaced with any form of birth control at this time.

## 2020-06-05 NOTE — Progress Notes (Signed)
   GYNECOLOGY OFFICE PROCEDURE NOTE  Kim Ali is a 30 y.o. G1P1001 here for mirena IUD removal. No GYN concerns.  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  IUD Removal  Patient identified, informed consent performed, consent signed.  Patient was in the dorsal lithotomy position, normal external genitalia was noted.  A speculum was placed in the patient's vagina, normal discharge was noted, no lesions. The cervix was visualized, no lesions, no abnormal discharge.  The strings of the IUD were grasped and pulled using ring forceps. The IUD was removed in its entirety. .  Patient tolerated the procedure well.    Patient wplans for pregnancy soon and she was told to avoid teratogens, take PNV and folic acid.  Routine preventative health maintenance measures emphasized.   Doreene Burke, CNM

## 2021-02-13 ENCOUNTER — Other Ambulatory Visit: Payer: Self-pay

## 2021-02-13 ENCOUNTER — Ambulatory Visit (INDEPENDENT_AMBULATORY_CARE_PROVIDER_SITE_OTHER): Payer: Self-pay | Admitting: Obstetrics and Gynecology

## 2021-02-13 ENCOUNTER — Encounter: Payer: Self-pay | Admitting: Obstetrics and Gynecology

## 2021-02-13 VITALS — BP 128/97 | HR 101 | Ht 67.0 in | Wt >= 6400 oz

## 2021-02-13 DIAGNOSIS — N912 Amenorrhea, unspecified: Secondary | ICD-10-CM

## 2021-02-13 DIAGNOSIS — Z3200 Encounter for pregnancy test, result unknown: Secondary | ICD-10-CM

## 2021-02-13 DIAGNOSIS — Z3201 Encounter for pregnancy test, result positive: Secondary | ICD-10-CM

## 2021-02-13 LAB — POCT URINE PREGNANCY: Preg Test, Ur: POSITIVE — AB

## 2021-02-13 MED ORDER — FOLIC ACID 1 MG PO TABS: 1.0000 mg | ORAL_TABLET | Freq: Every day | ORAL | 3 refills | Status: DC

## 2021-02-13 NOTE — Progress Notes (Signed)
Subjective:    Kim Ali is a 30 y.o. G44P1001 female who presents for evaluation of amenorrhea. She believes she could be pregnant. Pregnancy is desired. Sexual Activity: none. Current symptoms also include: fatigue, frequent urination, and positive home pregnancy test. Last period was normal.  Patient's last menstrual period was 12/28/2020 (exact date).   The following portions of the patient's history were reviewed and updated as appropriate:    OB History  Gravida Para Term Preterm AB Living  2 1 1     1   SAB IAB Ectopic Multiple Live Births        0 1    # Outcome Date GA Lbr Len/2nd Weight Sex Delivery Anes PTL Lv  2 Current           1 Term 08/04/15 [redacted]w[redacted]d / 00:25 5 lb 8.9 oz (2.52 kg) M Vag-Spont None  LIV     Birth Comments: none noted     She  has a past medical history of Morbid obesity with BMI of 60.0-69.9, adult (HCC) and Trichomoniasis (01/2017).  She  has a past surgical history that includes No past surgeries.  Her family history includes Breast cancer in her maternal grandmother; Diabetes in her maternal uncle, paternal grandfather, and paternal uncle.  She  reports that she has never smoked. She has never used smokeless tobacco. She reports that she does not currently use alcohol. She reports that she does not use drugs.  Current Outpatient Medications on File Prior to Visit  Medication Sig Dispense Refill   Prenatal Vit-Fe Fumarate-FA (PRENATAL PO) Take by mouth.     levonorgestrel (MIRENA) 20 MCG/24HR IUD 1 each by Intrauterine route once. (Patient not taking: Reported on 02/13/2021)     No current facility-administered medications on file prior to visit.   She is allergic to penicillin g..  Review of Systems Pertinent items noted in HPI and remainder of comprehensive ROS otherwise negative.     Objective:    BP (!) 128/97    Pulse (!) 101    Ht 5\' 7"  (1.702 m)    Wt (!) 407 lb 11.2 oz (184.9 kg)    LMP 12/28/2020 (Exact Date)    BMI 63.85  kg/m  General: alert, no distress, and no acute distress    Lab Review Urine HCG: positive    Assessment:    Absence of menstruation.  Pregnancy test positive  Morbid obesity   Plan:   - Pregnancy Test: Positive: EDC: 10/04/2020 with EGA 6.5 weeks. Briefly discussed pre-natal care options. Pregnancy, Childbirth and the Newborn book given. Encouraged well-balanced diet, plenty of rest when needed, pre-natal vitamins daily and walking for exercise. Discussed self-help for nausea, avoiding OTC medications until consulting provider or pharmacist, other than Tylenol as needed, minimal caffeine (1-2 cups daily) and avoiding alcohol. Advised on additional folic acid supplementation. She will schedule her initial OB visit and intake ultrasound. Feel free to call with any questions. - Morbid obesity, will need early anesthesia consult. Patient runs possibility of needing care at tertiary facility. Will need to begin daily baby aspirin after [redacted] weeks gestation. Will need early screen for diabetes.    13/11/2020, MD [facility]

## 2021-02-13 NOTE — Patient Instructions (Signed)
Common Medications Safe in Pregnancy  Acne:      Constipation:  Benzoyl Peroxide     Colace  Clindamycin      Dulcolax Suppository  Topica Erythromycin     Fibercon  Salicylic Acid      Metamucil         Miralax AVOID:        Senakot   Accutane    Cough:  Retin-A       Cough Drops  Tetracycline      Phenergan w/ Codeine if Rx  Minocycline      Robitussin (Plain & DM)  Antibiotics:     Crabs/Lice:  Ceclor       RID  Cephalosporins    AVOID:  E-Mycins      Kwell  Keflex  Macrobid/Macrodantin   Diarrhea:  Penicillin      Kao-Pectate  Zithromax      Imodium AD         PUSH FLUIDS AVOID:       Cipro     Fever:  Tetracycline      Tylenol (Regular or Extra  Minocycline       Strength)  Levaquin      Extra Strength-Do not          Exceed 8 tabs/24 hrs Caffeine:        <200mg/day (equiv. To 1 cup of coffee or  approx. 3 12 oz sodas)         Gas: Cold/Hayfever:       Gas-X  Benadryl      Mylicon  Claritin       Phazyme  **Claritin-D        Chlor-Trimeton    Headaches:  Dimetapp      ASA-Free Excedrin  Drixoral-Non-Drowsy     Cold Compress  Mucinex (Guaifenasin)     Tylenol (Regular or Extra  Sudafed/Sudafed-12 Hour     Strength)  **Sudafed PE Pseudoephedrine   Tylenol Cold & Sinus     Vicks Vapor Rub  Zyrtec  **AVOID if Problems With Blood Pressure         Heartburn: Avoid lying down for at least 1 hour after meals  Aciphex      Maalox     Rash:  Milk of Magnesia     Benadryl    Mylanta       1% Hydrocortisone Cream  Pepcid  Pepcid Complete   Sleep Aids:  Prevacid      Ambien   Prilosec       Benadryl  Rolaids       Chamomile Tea  Tums (Limit 4/day)     Unisom         Tylenol PM         Warm milk-add vanilla or  Hemorrhoids:       Sugar for taste  Anusol/Anusol H.C.  (RX: Analapram 2.5%)  Sugar Substitutes:  Hydrocortisone OTC     Ok in moderation  Preparation H      Tucks        Vaseline lotion applied to tissue with  wiping    Herpes:     Throat:  Acyclovir      Oragel  Famvir  Valtrex     Vaccines:         Flu Shot Leg Cramps:       *Gardasil  Benadryl      Hepatitis A         Hepatitis B Nasal Spray:         Pneumovax  Saline Nasal Spray     Polio Booster         Tetanus Nausea:       Tuberculosis test or PPD  Vitamin B6 25 mg TID   AVOID:    Dramamine      *Gardasil  Emetrol       Live Poliovirus  Ginger Root 250 mg QID    MMR (measles, mumps &  High Complex Carbs @ Bedtime    rebella)  Sea Bands-Accupressure    Varicella (Chickenpox)  Unisom 1/2 tab TID     *No known complications           If received before Pain:         Known pregnancy;   Darvocet       Resume series after  Lortab        Delivery  Percocet    Yeast:   Tramadol      Femstat  Tylenol 3      Gyne-lotrimin  Ultram       Monistat  Vicodin           MISC:         All Sunscreens           Hair Coloring/highlights          Insect Repellant's          (Including DEET)         Mystic Tans      First Trimester of Pregnancy The first trimester of pregnancy starts on the first day of your last menstrual period until the end of week 12. This is also called months 1 through 3 of pregnancy. Body changes during your first trimester Your body goes through many changes during pregnancy. The changes usually return to normal after your baby is born. Physical changes You may gain or lose weight. Your breasts may grow larger and hurt. The area around your nipples may get darker. Dark spots or blotches may develop on your face. You may have changes in your hair. Health changes You may feel like you might vomit (nauseous), and you may vomit. You may have heartburn. You may have headaches. You may have trouble pooping (constipation). Your gums may bleed. Other changes You may get tired easily. You may pee (urinate) more often. Your menstrual periods will stop. You may not feel hungry. You may want to eat certain kinds of  food. You may have changes in your emotions from day to day. You may have more dreams. Follow these instructions at home: Medicines Take over-the-counter and prescription medicines only as told by your doctor. Some medicines are not safe during pregnancy. Take a prenatal vitamin that contains at least 600 micrograms (mcg) of folic acid. Eating and drinking Eat healthy meals that include: Fresh fruits and vegetables. Whole grains. Good sources of protein, such as meat, eggs, or tofu. Low-fat dairy products. Avoid raw meat and unpasteurized juice, milk, and cheese. If you feel like you may vomit, or you vomit: Eat 4 or 5 small meals a day instead of 3 large meals. Try eating a few soda crackers. Drink liquids between meals instead of during meals. You may need to take these actions to prevent or treat trouble pooping: Drink enough fluids to keep your pee (urine) pale yellow. Eat foods that are high in fiber. These include beans, whole grains, and fresh fruits and vegetables. Limit foods that are high in fat and sugar. These include fried or sweet foods.  Activity Exercise only as told by your doctor. Most people can do their usual exercise routine during pregnancy. Stop exercising if you have cramps or pain in your lower belly (abdomen) or low back. Do not exercise if it is too hot or too humid, or if you are in a place of great height (high altitude). Avoid heavy lifting. If you choose to, you may have sex unless your doctor tells you not to. Relieving pain and discomfort Wear a good support bra if your breasts are sore. Rest with your legs raised (elevated) if you have leg cramps or low back pain. If you have bulging veins (varicose veins) in your legs: Wear support hose as told by your doctor. Raise your feet for 15 minutes, 3-4 times a day. Limit salt in your food. Safety Wear your seat belt at all times when you are in a car. Talk with your doctor if someone is hurting you or  yelling at you. Talk with your doctor if you are feeling sad or have thoughts of hurting yourself. Lifestyle Do not use hot tubs, steam rooms, or saunas. Do not douche. Do not use tampons or scented sanitary pads. Do not use herbal medicines, illegal drugs, or medicines that are not approved by your doctor. Do not drink alcohol. Do not smoke or use any products that contain nicotine or tobacco. If you need help quitting, ask your doctor. Avoid cat litter boxes and soil that is used by cats. These carry germs that can cause harm to the baby and can cause a loss of your baby by miscarriage or stillbirth. General instructions Keep all follow-up visits. This is important. Ask for help if you need counseling or if you need help with nutrition. Your doctor can give you advice or tell you where to go for help. Visit your dentist. At home, brush your teeth with a soft toothbrush. Floss gently. Write down your questions. Take them to your prenatal visits. Where to find more information American Pregnancy Association: americanpregnancy.org [employer]: www.acog.org Office on Women's Health: KeywordPortfolios.com.br Contact a doctor if: You are dizzy. You have a fever. You have mild cramps or pressure in your lower belly. You have a nagging pain in your belly area. You continue to feel like you may vomit, you vomit, or you have watery poop (diarrhea) for 24 hours or longer. You have a bad-smelling fluid coming from your vagina. You have pain when you pee. You are exposed to a disease that spreads from person to person, such as chickenpox, measles, Zika virus, HIV, or hepatitis. Get help right away if: You have spotting or bleeding from your vagina. You have very bad belly cramping or pain. You have shortness of breath or chest pain. You have any kind of injury, such as from a fall or a car crash. You have new or increased pain, swelling, or redness in an  arm or leg. Summary The first trimester of pregnancy starts on the first day of your last menstrual period until the end of week 12 (months 1 through 3). Eat 4 or 5 small meals a day instead of 3 large meals. Do not smoke or use any products that contain nicotine or tobacco. If you need help quitting, ask your doctor. Keep all follow-up visits. This information is not intended to replace advice given to you by your health care provider. Make sure you discuss any questions you have with your health care provider. Document Revised: 07/14/2019 Document Reviewed: 05/20/2019  Elsevier Patient Education  2022 [employer].

## 2021-02-18 NOTE — L&D Delivery Note (Signed)
LABOR COURSE Pitocin, progression to complete  Delivery Note Called to room and patient was complete and pushing. Head delivered ROA. No nuchal cord present. Shoulder and body delivered in usual fashion. At  1553 a viable female was delivered via Vaginal, Spontaneous.  Infant with spontaneous cry, placed on mother's abdomen, dried and stimulated. Cord clamped x 2 after 1-minute delay, and cut by grandmother. Baby then taken to warmer to be assessed by MD. Cord blood drawn. Placenta delivered spontaneously with gentle cord traction. Appears intact. Fundus firm with massage and Pitocin. Labia, perineum, vagina, and cervix inspected with hemostatic first degree periurethral and hemostatic abrasion to perineum.    APGAR: 9, 9; weight pending .   Cord: 3VC   Anesthesia:  none Episiotomy: None Lacerations: hemostatic first degree periurethral and hemostatic abrasion to perineum Est. Blood Loss (mL): 50  Mom to postpartum.  Baby to Couplet care / Skin to Skin.  Glendale Chard, DO 09/21/21 4:07 PM

## 2021-03-05 ENCOUNTER — Ambulatory Visit (INDEPENDENT_AMBULATORY_CARE_PROVIDER_SITE_OTHER): Payer: Commercial Managed Care - PPO | Admitting: Obstetrics and Gynecology

## 2021-03-05 ENCOUNTER — Other Ambulatory Visit: Payer: Self-pay | Admitting: Obstetrics and Gynecology

## 2021-03-05 ENCOUNTER — Other Ambulatory Visit: Payer: Self-pay

## 2021-03-05 VITALS — BP 131/92 | HR 99 | Resp 16 | Ht 66.0 in | Wt >= 6400 oz

## 2021-03-05 DIAGNOSIS — O3680X0 Pregnancy with inconclusive fetal viability, not applicable or unspecified: Secondary | ICD-10-CM

## 2021-03-05 DIAGNOSIS — Z3687 Encounter for antenatal screening for uncertain dates: Secondary | ICD-10-CM | POA: Diagnosis not present

## 2021-03-05 DIAGNOSIS — O99210 Obesity complicating pregnancy, unspecified trimester: Secondary | ICD-10-CM

## 2021-03-05 DIAGNOSIS — Z113 Encounter for screening for infections with a predominantly sexual mode of transmission: Secondary | ICD-10-CM

## 2021-03-05 DIAGNOSIS — Z3481 Encounter for supervision of other normal pregnancy, first trimester: Secondary | ICD-10-CM | POA: Diagnosis not present

## 2021-03-05 DIAGNOSIS — Z3A09 9 weeks gestation of pregnancy: Secondary | ICD-10-CM

## 2021-03-05 NOTE — Patient Instructions (Signed)
First Trimester of Pregnancy °The first trimester of pregnancy starts on the first day of your last menstrual period until the end of week 12. This is also called months 1 through 3 of pregnancy. °Body changes during your first trimester °Your body goes through many changes during pregnancy. The changes usually return to normal after your baby is born. °Physical changes °You may gain or lose weight. °Your breasts may grow larger and hurt. The area around your nipples may get darker. °Dark spots or blotches may develop on your face. °You may have changes in your hair. °Health changes °You may feel like you might vomit (nauseous), and you may vomit. °You may have heartburn. °You may have headaches. °You may have trouble pooping (constipation). °Your gums may bleed. °Other changes °You may get tired easily. °You may pee (urinate) more often. °Your menstrual periods will stop. °You may not feel hungry. °You may want to eat certain kinds of food. °You may have changes in your emotions from day to day. °You may have more dreams. °Follow these instructions at home: °Medicines °Take over-the-counter and prescription medicines only as told by your doctor. Some medicines are not safe during pregnancy. °Take a prenatal vitamin that contains at least 600 micrograms (mcg) of folic acid. °Eating and drinking °Eat healthy meals that include: °Fresh fruits and vegetables. °Whole grains. °Good sources of protein, such as meat, eggs, or tofu. °Low-fat dairy products. °Avoid raw meat and unpasteurized juice, milk, and cheese. °If you feel like you may vomit, or you vomit: °Eat 4 or 5 small meals a day instead of 3 large meals. °Try eating a few soda crackers. °Drink liquids between meals instead of during meals. °You may need to take these actions to prevent or treat trouble pooping: °Drink enough fluids to keep your pee (urine) pale yellow. °Eat foods that are high in fiber. These include beans, whole grains, and fresh fruits and  vegetables. °Limit foods that are high in fat and sugar. These include fried or sweet foods. °Activity °Exercise only as told by your doctor. Most people can do their usual exercise routine during pregnancy. °Stop exercising if you have cramps or pain in your lower belly (abdomen) or low back. °Do not exercise if it is too hot or too humid, or if you are in a place of great height (high altitude). °Avoid heavy lifting. °If you choose to, you may have sex unless your doctor tells you not to. °Relieving pain and discomfort °Wear a good support bra if your breasts are sore. °Rest with your legs raised (elevated) if you have leg cramps or low back pain. °If you have bulging veins (varicose veins) in your legs: °Wear support hose as told by your doctor. °Raise your feet for 15 minutes, 3-4 times a day. °Limit salt in your food. °Safety °Wear your seat belt at all times when you are in a car. °Talk with your doctor if someone is hurting you or yelling at you. °Talk with your doctor if you are feeling sad or have thoughts of hurting yourself. °Lifestyle °Do not use hot tubs, steam rooms, or saunas. °Do not douche. Do not use tampons or scented sanitary pads. °Do not use herbal medicines, illegal drugs, or medicines that are not approved by your doctor. Do not drink alcohol. °Do not smoke or use any products that contain nicotine or tobacco. If you need help quitting, ask your doctor. °Avoid cat litter boxes and soil that is used by cats. These carry germs   that can cause harm to the baby and can cause a loss of your baby by miscarriage or stillbirth. °General instructions °Keep all follow-up visits. This is important. °Ask for help if you need counseling or if you need help with nutrition. Your doctor can give you advice or tell you where to go for help. °Visit your dentist. At home, brush your teeth with a soft toothbrush. Floss gently. °Write down your questions. Take them to your prenatal visits. °Where to find more  information °American Pregnancy Association: americanpregnancy.org °American College of Obstetricians and Gynecologists: www.acog.org °Office on Women's Health: womenshealth.gov/pregnancy °Contact a doctor if: °You are dizzy. °You have a fever. °You have mild cramps or pressure in your lower belly. °You have a nagging pain in your belly area. °You continue to feel like you may vomit, you vomit, or you have watery poop (diarrhea) for 24 hours or longer. °You have a bad-smelling fluid coming from your vagina. °You have pain when you pee. °You are exposed to a disease that spreads from person to person, such as chickenpox, measles, Zika virus, HIV, or hepatitis. °Get help right away if: °You have spotting or bleeding from your vagina. °You have very bad belly cramping or pain. °You have shortness of breath or chest pain. °You have any kind of injury, such as from a fall or a car crash. °You have new or increased pain, swelling, or redness in an arm or leg. °Summary °The first trimester of pregnancy starts on the first day of your last menstrual period until the end of week 12 (months 1 through 3). °Eat 4 or 5 small meals a day instead of 3 large meals. °Do not smoke or use any products that contain nicotine or tobacco. If you need help quitting, ask your doctor. °Keep all follow-up visits. °This information is not intended to replace advice given to you by your health care provider. Make sure you discuss any questions you have with your health care provider. °Document Revised: 07/14/2019 Document Reviewed: 05/20/2019 °Elsevier Patient Education © 2022 Else[employer] Inc[employer] °Commonly Asked Questions During Pregnancy ° °Cats: A parasite can be excreted in cat feces.  To avoid exposure you need to have another person empty the little box.  If you must empty the litter box you will need to wear gloves.  Wash your hands after handling your cat.  This parasite can also be found in raw or undercooked meat so this should also be  avoided. ° °Colds, Sore Throats, Flu: Please check your medication sheet to see what you can take for symptoms.  If your symptoms are unrelieved by these medications please call the office. ° °Dental Work: Most any dental work your dentist recommends is permitted.  X-rays should only be taken during the first trimester if absolutely necessary.  Your abdomen should be shielded with a lead apron during all x-rays.  Please notify your provider prior to receiving any x-rays.  Novocaine is fine; gas is not recommended.  If your dentist requires a note from us prior to dental work please call the office and we will provide one for you. ° °Exercise: Exercise is an important part of staying healthy during your pregnancy.  You may continue most exercises you were accustomed to prior to pregnancy.  Later in your pregnancy you will most likely notice you have difficulty with activities requiring balance like riding a bicycle.  It is important that you listen to your body and avoid activities that put you at a higher risk   of falling.  Adequate rest and staying well hydrated are a must!  If you have questions about the safety of specific activities ask your provider.   ° °Exposure to Children with illness: Try to avoid obvious exposure; report any symptoms to us when noted,  If you have chicken pos, red measles or mumps, you should be immune to these diseases.   Please do not take any vaccines while pregnant unless you have checked with your OB provider. ° °Fetal Movement: After 28 weeks we recommend you do "kick counts" twice daily.  Lie or sit down in a calm quiet environment and count your baby movements "kicks".  You should feel your baby at least 10 times per hour.  If you have not felt 10 kicks within the first hour get up, walk around and have something sweet to eat or drink then repeat for an additional hour.  If count remains less than 10 per hour notify your provider. ° °Fumigating: Follow your pest control agent's  advice as to how long to stay out of your home.  Ventilate the area well before re-entering. ° °Hemorrhoids:   Most over-the-counter preparations can be used during pregnancy.  Check your medication to see what is safe to use.  It is important to use a stool softener or fiber in your diet and to drink lots of liquids.  If hemorrhoids seem to be getting worse please call the office.  ° °Hot Tubs:  Hot tubs Jacuzzis and saunas are not recommended while pregnant.  These increase your internal body temperature and should be avoided. ° °Intercourse:  Sexual intercourse is safe during pregnancy as long as you are comfortable, unless otherwise advised by your provider.  Spotting may occur after intercourse; report any bright red bleeding that is heavier than spotting. ° °Labor:  If you know that you are in labor, please go to the hospital.  If you are unsure, please call the office and let us help you decide what to do. ° °Lifting, straining, etc:  If your job requires heavy lifting or straining please check with your provider for any limitations.  Generally, you should not lift items heavier than that you can lift simply with your hands and arms (no back muscles) ° °Painting:  Paint fumes do not harm your pregnancy, but may make you ill and should be avoided if possible.  Latex or water based paints have less odor than oils.  Use adequate ventilation while painting. ° °Permanents & Hair Color:  Chemicals in hair dyes are not recommended as they cause increase hair dryness which can increase hair loss during pregnancy.  " Highlighting" and permanents are allowed.  Dye may be absorbed differently and permanents may not hold as well during pregnancy. ° °Sunbathing:  Use a sunscreen, as skin burns easily during pregnancy.  Drink plenty of fluids; avoid over heating. ° °Tanning Beds:  Because their possible side effects are still unknown, tanning beds are not recommended. ° °Ultrasound Scans:  Routine ultrasounds are performed  at approximately 20 weeks.  You will be able to see your baby's general anatomy an if you would like to know the gender this can usually be determined as well.  If it is questionable when you conceived you may also receive an ultrasound early in your pregnancy for dating purposes.  Otherwise ultrasound exams are not routinely performed unless there is a medical necessity.  Although you can request a scan we ask that you pay for it when conducted   because insurance does not cover " patient request" scans. ° °Work: If your pregnancy proceeds without complications you may work until your due date, unless your physician or employer advises otherwise. ° °Round Ligament Pain/Pelvic Discomfort:  Sharp, shooting pains not associated with bleeding are fairly common, usually occurring in the second trimester of pregnancy.  They tend to be worse when standing up or when you remain standing for long periods of time.  These are the result of pressure of certain pelvic ligaments called "round ligaments".  Rest, Tylenol and heat seem to be the most effective relief.  As the womb and fetus grow, they rise out of the pelvis and the discomfort improves.  Please notify the office if your pain seems different than that described.  It may represent a more serious condition. ° °Morning Sickness °Morning sickness is when you feel like you may vomit (feel nauseous) during pregnancy. Sometimes, you may vomit. Morning sickness most often happens in the morning, but it can also happen at any time of the day. Some women may have morning sickness that makes them vomit all the time. This is a more serious problem that needs treatment. °What are the causes? °The cause of this condition is not known. °What increases the risk? °You had vomiting or a feeling like you may vomit before your pregnancy. °You had morning sickness in another pregnancy. °You are pregnant with more than one baby, such as twins. °What are the signs or symptoms? °Feeling like  you may vomit. °Vomiting. °How is this treated? °Treatment is usually not needed for this condition. You may only need to change what you eat. In some cases, your doctor may give you some things to take for your condition. These include: °Vitamin B6 supplements. °Medicines to treat the feeling that you may vomit. °Ginger. °Follow these instructions at home: °Medicines °Take over-the-counter and prescription medicines only as told by your doctor. Do not take any medicines until you talk with your doctor about them first. °Take multivitamins before you get pregnant. These can stop or lessen the symptoms of morning sickness. °Eating and drinking °Eat dry toast or crackers before getting out of bed. °Eat 5 or 6 small meals a day. °Eat dry and bland foods like rice and baked potatoes. °Do not eat greasy, fatty, or spicy foods. °Have someone cook for you if the smell of food causes you to vomit or to feel like you may vomit. °If you feel like you may vomit after taking prenatal vitamins, take them at night or with a snack. °Eat protein foods when you need a snack. Nuts, yogurt, and cheese are good choices. °Drink fluids throughout the day. °Try ginger ale made with real ginger, ginger tea made from fresh grated ginger, or ginger candies. °General instructions °Do not smoke or use any products that contain nicotine or tobacco. If you need help quitting, ask your doctor. °Use an air purifier to keep the air in your house free of smells. °Get lots of fresh air. °Try to avoid smells that make you feel sick. °Try wearing an acupressure wristband. This is a wristband that is used to treat seasickness. °Try a treatment called acupuncture. In this treatment, a doctor puts needles into certain areas of your body to make you feel better. °Contact a doctor if: °You need medicine to feel better. °You feel dizzy or light-headed. °You are losing weight. °Get help right away if: °The feeling that you may vomit will not go away, or you  cannot   stop vomiting. °You faint. °You have very bad pain in your belly. °Summary °Morning sickness is when you feel like you may vomit (feel nauseous) during pregnancy. °You may feel sick in the morning, but you can feel this way at any time of the day. °Making some changes to what you eat may help your symptoms go away. °This information is not intended to replace advice given to you by your health care provider. Make sure you discuss any questions you have with your health care provider. °Document Revised: 09/20/2019 Document Reviewed: 08/30/2019 °Elsevier Patient Education © 2022 [employer]. ° °

## 2021-03-05 NOTE — Progress Notes (Signed)
Kim Ali presents for NOB nurse interview visit. Pregnancy confirmation done 02/13/2021.  G2P1001. Pregnancy education material explained and given. No cats in the home. NOB labs ordered. TSH/HbgA1c due to Increased BMI. Body mass index is 65.79 kg/m. Sickle cell ordered. HIV labs and Drug screen were explained optional and she did not decline. Drug screen ordered. PNV encouraged. Genetic screening options discussed. Genetic testing: Ordered.  Pt may discuss with provider.  Financial policy reviewed. FMLA form reviewed and signed. Pt. To follow up with Cherry in 2 weeks for NOB physical.  All questions answered. OB Anesthesia Consult called appointment set for March 20 @ 9:00 am.

## 2021-03-06 ENCOUNTER — Ambulatory Visit (INDEPENDENT_AMBULATORY_CARE_PROVIDER_SITE_OTHER): Payer: Commercial Managed Care - PPO

## 2021-03-06 DIAGNOSIS — Z3687 Encounter for antenatal screening for uncertain dates: Secondary | ICD-10-CM

## 2021-03-06 DIAGNOSIS — O3680X0 Pregnancy with inconclusive fetal viability, not applicable or unspecified: Secondary | ICD-10-CM | POA: Diagnosis not present

## 2021-03-06 LAB — URINALYSIS, ROUTINE W REFLEX MICROSCOPIC
Bilirubin, UA: NEGATIVE
Glucose, UA: NEGATIVE
Nitrite, UA: NEGATIVE
RBC, UA: NEGATIVE
Specific Gravity, UA: >=1.03 — AB (ref 1.005–1.030)
Urobilinogen, Ur: 1 mg/dL (ref 0.2–1.0)
pH, UA: 5.5 (ref 5.0–7.5)

## 2021-03-06 LAB — HCV INTERPRETATION

## 2021-03-06 LAB — PAIN MGT SCRN (14 DRUGS), UR
Amphetamine Scrn, Ur: NEGATIVE ng/mL
BARBITURATE SCREEN URINE: NEGATIVE ng/mL
BENZODIAZEPINE SCREEN, URINE: NEGATIVE ng/mL
Buprenorphine, Urine: NEGATIVE ng/mL
CANNABINOIDS UR QL SCN: NEGATIVE ng/mL
Cocaine (Metab) Scrn, Ur: NEGATIVE ng/mL
Creatinine(Crt), U: 342 mg/dL — ABNORMAL HIGH (ref 20.0–300.0)
Fentanyl, Urine: NEGATIVE pg/mL
Meperidine Screen, Urine: NEGATIVE ng/mL
Methadone Screen, Urine: NEGATIVE ng/mL
OXYCODONE+OXYMORPHONE UR QL SCN: NEGATIVE ng/mL
Opiate Scrn, Ur: NEGATIVE ng/mL
Ph of Urine: 5.2 (ref 4.5–8.9)
Phencyclidine Qn, Ur: NEGATIVE ng/mL
Propoxyphene Scrn, Ur: NEGATIVE ng/mL
Tramadol Screen, Urine: NEGATIVE ng/mL

## 2021-03-06 LAB — VIRAL HEPATITIS HBV, HCV
HCV Ab: <0.1 s/co ratio (ref 0.0–0.9)
Hep B Core Total Ab: NEGATIVE
Hep B Surface Ab, Qual: NONREACTIVE
Hepatitis B Surface Ag: NEGATIVE

## 2021-03-06 LAB — MICROSCOPIC EXAMINATION
Casts: NONE SEEN /lpf
WBC, UA: >30 /hpf — AB (ref 0–5)

## 2021-03-06 LAB — HIV ANTIBODY (ROUTINE TESTING W REFLEX): HIV Screen 4th Generation wRfx: NONREACTIVE

## 2021-03-06 LAB — NICOTINE SCREEN, URINE: Cotinine Ql Scrn, Ur: NEGATIVE ng/mL

## 2021-03-06 LAB — RUBELLA SCREEN: Rubella Antibodies, IGG: 3.43 index (ref >0.99)

## 2021-03-06 LAB — ANTIBODY SCREEN: Antibody Screen: NEGATIVE

## 2021-03-06 LAB — VARICELLA ZOSTER ANTIBODY, IGG: Varicella zoster IgG: 332 index (ref >165)

## 2021-03-06 LAB — ABO AND RH: Rh Factor: POSITIVE

## 2021-03-06 LAB — HEMOGLOBIN A1C
Est. average glucose Bld gHb Est-mCnc: 111 mg/dL
Hgb A1c MFr Bld: 5.5 % (ref 4.8–5.6)

## 2021-03-06 LAB — TSH: TSH: 1.8 u[IU]/mL (ref 0.450–4.500)

## 2021-03-06 LAB — RPR: RPR Ser Ql: NONREACTIVE

## 2021-03-06 LAB — PARVOVIRUS B19 ANTIBODY, IGG AND IGM
Parvovirus B19 IgG: 3.7 index — ABNORMAL HIGH (ref 0.0–0.8)
Parvovirus B19 IgM: 0.1 index (ref 0.0–0.8)

## 2021-03-06 LAB — HGB SOLU + RFLX FRAC: Sickle Solubility Test - HGBRFX: NEGATIVE

## 2021-03-07 LAB — GC/CHLAMYDIA PROBE AMP
Chlamydia trachomatis, NAA: NEGATIVE
Neisseria Gonorrhoeae by PCR: NEGATIVE

## 2021-03-07 LAB — CULTURE, OB URINE

## 2021-03-07 LAB — URINE CULTURE, OB REFLEX

## 2021-03-22 ENCOUNTER — Other Ambulatory Visit: Payer: Self-pay

## 2021-03-22 ENCOUNTER — Encounter: Payer: Self-pay | Admitting: Obstetrics and Gynecology

## 2021-03-22 ENCOUNTER — Other Ambulatory Visit (HOSPITAL_COMMUNITY)
Admission: RE | Admit: 2021-03-22 | Discharge: 2021-03-22 | Disposition: A | Discharge status: Home / Self Care | Payer: Commercial Managed Care - PPO | Source: Ambulatory Visit | Attending: Obstetrics and Gynecology | Admitting: Obstetrics and Gynecology

## 2021-03-22 ENCOUNTER — Ambulatory Visit (INDEPENDENT_AMBULATORY_CARE_PROVIDER_SITE_OTHER): Payer: Commercial Managed Care - PPO | Admitting: Obstetrics and Gynecology

## 2021-03-22 VITALS — BP 145/92 | HR 99 | Ht 66.0 in | Wt >= 6400 oz

## 2021-03-22 DIAGNOSIS — Z3A12 12 weeks gestation of pregnancy: Secondary | ICD-10-CM

## 2021-03-22 DIAGNOSIS — Z124 Encounter for screening for malignant neoplasm of cervix: Secondary | ICD-10-CM

## 2021-03-22 DIAGNOSIS — Z1379 Encounter for other screening for genetic and chromosomal anomalies: Secondary | ICD-10-CM

## 2021-03-22 DIAGNOSIS — O0991 Supervision of high risk pregnancy, unspecified, first trimester: Secondary | ICD-10-CM

## 2021-03-22 DIAGNOSIS — O10911 Unspecified pre-existing hypertension complicating pregnancy, first trimester: Secondary | ICD-10-CM

## 2021-03-22 DIAGNOSIS — Z6841 Body Mass Index (BMI) 40.0 and over, adult: Secondary | ICD-10-CM

## 2021-03-22 LAB — POCT URINALYSIS DIPSTICK OB
Bilirubin, UA: NEGATIVE
Blood, UA: NEGATIVE
Glucose, UA: NEGATIVE
Ketones, UA: NEGATIVE
Leukocytes, UA: NEGATIVE
Nitrite, UA: NEGATIVE
POC,PROTEIN,UA: NEGATIVE
Spec Grav, UA: 1.015 (ref 1.010–1.025)
Urobilinogen, UA: 0.2 E.U./dL
pH, UA: 7 (ref 5.0–8.0)

## 2021-03-22 MED ORDER — ASPIRIN EC 81 MG PO TBEC: 81.0000 mg | DELAYED_RELEASE_TABLET | Freq: Every day | ORAL | 2 refills | Status: DC

## 2021-03-22 MED ORDER — NIFEDIPINE ER OSMOTIC RELEASE 30 MG PO TB24: 30.0000 mg | ORAL_TABLET | Freq: Every day | ORAL | 3 refills | Status: DC

## 2021-03-22 MED ORDER — BLOOD PRESSURE KIT DEVI: 1.0000 | Freq: Once | 0 refills | Status: AC

## 2021-03-22 NOTE — Progress Notes (Signed)
OBSTETRIC INITIAL PRENATAL VISIT  Subjective:    Kim Ali is being seen today for her first obstetrical visit.  This is a planned pregnancy. She is a 31 y.o. G2P1001 female at [redacted]w[redacted]d gestation, Estimated Date of Delivery: 10/04/21 with Patient's last menstrual period was 12/28/2020 (exact date).,  consistent with 9 week sono. Her obstetrical history is significant for morbid obesity, elevated blood pressures with no h/o HTN. Relationship with FOB: significant other, living together. Patient does intend to breast feed. Pregnancy history fully reviewed.   Does note some days spotting last week. Had mild associated cramping.    OB History  Gravida Para Term Preterm AB Living  2 1 1  0 0 1  SAB IAB Ectopic Multiple Live Births  0 0 0 0 1    # Outcome Date GA Lbr Len/2nd Weight Sex Delivery Anes PTL Lv  2 Current           1 Term 08/04/15 [redacted]w[redacted]d / 00:25 5 lb 8.9 oz (2.52 kg) M Vag-Spont None  LIV     Birth Comments: none noted     Name: Bill,BOY Lashuna     Apgar1: 5  Apgar5: 8    Gynecologic History:  Last pap smear was 06/2016.  Results were Normal. H/o abnormal pap smears x 1 (ASCUS) in 2017.  Reports history of STI: Trichomoniasis.  Contraception prior to conception: ID removed last year   Past Medical History:  Diagnosis Date   Morbid obesity with BMI of 60.0-69.9, adult ([place])    Trichomoniasis 01/2017    Family History  Problem Relation Age of Onset   Hypertension Mother    Hypertension Father    Diabetes Maternal Uncle    Diabetes Paternal Uncle    Hypertension Maternal Grandmother    Aneurysm Paternal Grandmother    Diabetes Paternal Grandfather    Brain cancer Paternal Grandfather    Prostate cancer Paternal Grandfather    Breast cancer Maternal Great-grandmother    Ovarian cancer Neg Hx    Colon cancer Neg Hx    Heart disease Neg Hx     Past Surgical History:  Procedure Laterality Date   NO PAST SURGERIES      Social History    Socioeconomic History   Marital status: Single    Spouse name: Not on file   Number of children: Not on file   Years of education: Not on file   Highest education level: Not on file  Occupational History   Not on file  Tobacco Use   Smoking status: Never   Smokeless tobacco: Never  Substance and Sexual Activity   Alcohol use: Not Currently    Alcohol/week: 0.0 standard drinks    Comment: rarely   Drug use: No   Sexual activity: Yes    Partners: Male    Comment: mirena  Other Topics Concern   Not on file  Social History Narrative   Not on file   Social Determinants of Health   Financial Resource Strain: Not on file  Food Insecurity: Not on file  Transportation Needs: Not on file  Physical Activity: Not on file  Stress: Not on file  Social Connections: Not on file  Intimate Partner Violence: Not on file    Current Outpatient Medications on File Prior to Visit  Medication Sig Dispense Refill   folic acid (FOLVITE) 1 MG tablet Take 1 tablet (1 mg total) by mouth daily. 90 tablet 3   Prenatal Vit-Fe Fumarate-FA (PRENATAL PO) Take by  mouth.     triamcinolone ointment (KENALOG) 0.1 % Apply topically.     No current facility-administered medications on file prior to visit.    Allergies  Allergen Reactions   Penicillin G Other (See Comments)     Review of Systems General: Not Present- Fever, Weight Loss and Weight Gain. Skin: Not Present- Rash. HEENT: Not Present- Blurred Vision, Headache and Bleeding Gums. Respiratory: Not Present- Difficulty Breathing. Breast: Not Present- Breast Mass. Cardiovascular: Not Present- Chest Pain, Elevated Blood Pressure, Fainting / Blacking Out and Shortness of Breath. Gastrointestinal: Not Present- Abdominal Pain, Constipation, Nausea and Vomiting. Female Genitourinary: Not Present- Frequency, Painful Urination, Pelvic Pain, Vaginal Bleeding, Vaginal Discharge, Contractions, regular, Fetal Movements Decreased, Urinary Complaints  and Vaginal Fluid. Musculoskeletal: Not Present- Back Pain and Leg Cramps. Neurological: Not Present- Dizziness. Psychiatric: Not Present- Depression.     Objective:   Blood pressure (!) 145/92, pulse 99, weight (!) 410 lb (186 kg), last menstrual period 12/28/2020.  Body mass index is 66.18 kg/m.   General Appearance:    Alert, cooperative, no distress, appears stated age  Head:    Normocephalic, without obvious abnormality, atraumatic  Eyes:    PERRL, conjunctiva/corneas clear, EOM's intact, both eyes  Ears:    Normal external ear canals, both ears  Nose:   Nares normal, septum midline, mucosa normal, no drainage or sinus tenderness  Throat:   Lips, mucosa, and tongue normal; teeth and gums normal  Neck:   Supple, symmetrical, trachea midline, no adenopathy; thyroid: no enlargement/tenderness/nodules; no carotid bruit or JVD  Back:     Symmetric, no curvature, ROM normal, no CVA tenderness  Lungs:     Clear to auscultation bilaterally, respirations unlabored  Chest Wall:    No tenderness or deformity   Heart:    Regular rate and rhythm, S1 and S2 normal, no murmur, rub or gallop  Breast Exam:    No tenderness, masses, or nipple abnormality  Abdomen:     Soft, non-tender, bowel sounds active all four quadrants, no masses, no organomegaly.  FHT 156 bpm.  Genitalia:    Pelvic:external genitalia normal, vagina without lesions, discharge, or tenderness, rectovaginal septum  normal. Cervix normal in appearance, no cervical motion tenderness, no adnexal masses or tenderness.  Unable to palpate uterus due to body habitus.  Rectal:    Normal external sphincter.  No hemorrhoids appreciated. Internal exam not done.   Extremities:   Extremities normal, atraumatic, no cyanosis or edema  Pulses:   2+ and symmetric all extremities  Skin:   Skin color, texture, turgor normal, no rashes or lesions  Lymph nodes:   Cervical, supraclavicular, and axillary nodes normal  Neurologic:   CNII-XII intact,  normal strength, sensation and reflexes throughout     Assessment:   1. Pregnancy, supervision, high-risk, first trimester   2. [redacted] weeks gestation of pregnancy   3. Morbid obesity with BMI of 60.0-69.9, adult ([place])   4. Genetic screening   5. Pre-existing hypertension affecting pregnancy in first trimester   6. Cervical cancer screening     Plan:   Pregnancy, supervision, high-risk, first trimester - Initial labs reviewed. - Prenatal vitamins encouraged. - Problem list reviewed and updated. - New OB counseling:  The patient has been given an overview regarding routine prenatal care.   - Prenatal testing, optional genetic testing, and ultrasound use in pregnancy were reviewed.  Traditional genetic screening vs cell-fee DNA genetic screening discussed, including risks and benefits. Testing ordered. Desires [id number] - Benefits of  Breast Feeding were discussed. The patient is encouraged to consider nursing her baby post partum.   2.  Morbid obesity with BMI of 60.0-69.9, adult ([place]) - Recommendations regarding diet, weight gain, and exercise in pregnancy were given.  Patient advised to gain no more than 10 lbs this pregnancy.  - Is scheduled for Anesthesia consult next week. Discussed possibility of needing transfer to a tertiary care facility. Patient had previous delivery at [facility], however BMI was somewhat lower then.   3.  Pre-existing hypertension affecting pregnancy in first trimester - Patient with no prior history of HTN, however last 2 visits BP has been elevated. Based on values, will diagnose with HTN and treat with Procardia 30 mg daily. Also prescribed BP cuff for patient to monitor BPs at home.  - Will refer to Cardiology as patient at increased risk for cardiac issues postpartum due to obesity and HTN.  - Initiated on daily baby aspirin. May need to increase dosing to 162 mg due to BMI.   4.  Cervical cancer screening - Pap smear performed today.    Follow up in 4  weeks.    Rubie Maid, MD [facility]

## 2021-03-22 NOTE — Patient Instructions (Signed)
WHAT OB PATIENTS CAN EXPECT  Confirmation of pregnancy and ultrasound ordered if medically indicated-[redacted] weeks gestation New OB (NOB) intake with nurse and New OB (NOB) labs- [redacted] weeks gestation New OB (NOB) physical examination with provider- 11/[redacted] weeks gestation Flu vaccine-[redacted] weeks gestation Anatomy scan-[redacted] weeks gestation Glucose tolerance test, blood work to test for anemia, T-dap vaccine-[redacted] weeks gestation Vaginal swabs/cultures-STD/Group B strep-[redacted] weeks gestation Appointments will be as follows:  Every 4 weeks until 28 weeks Every 2 weeks from 28 weeks until 36 weeks Every 1 week from 36 weeks until delivery

## 2021-03-23 LAB — COMPREHENSIVE METABOLIC PANEL
ALT: 12 IU/L (ref 0–32)
AST: 12 IU/L (ref 0–40)
Albumin/Globulin Ratio: 1.2 (ref 1.2–2.2)
Albumin: 3.7 g/dL — ABNORMAL LOW (ref 3.9–5.0)
Alkaline Phosphatase: 74 IU/L (ref 44–121)
BUN/Creatinine Ratio: 11 (ref 9–23)
BUN: 7 mg/dL (ref 6–20)
Bilirubin Total: 0.3 mg/dL (ref 0.0–1.2)
CO2: 22 mmol/L (ref 20–29)
Calcium: 8.9 mg/dL (ref 8.7–10.2)
Chloride: 101 mmol/L (ref 96–106)
Creatinine, Ser: 0.62 mg/dL (ref 0.57–1.00)
Globulin, Total: 3.1 g/dL (ref 1.5–4.5)
Glucose: 79 mg/dL (ref 70–99)
Potassium: 4.2 mmol/L (ref 3.5–5.2)
Sodium: 135 mmol/L (ref 134–144)
Total Protein: 6.8 g/dL (ref 6.0–8.5)
eGFR: 124 mL/min/{1.73_m2} (ref >59)

## 2021-03-23 LAB — CBC
Hematocrit: 39.2 % (ref 34.0–46.6)
Hemoglobin: 13.2 g/dL (ref 11.1–15.9)
MCH: 29.5 pg (ref 26.6–33.0)
MCHC: 33.7 g/dL (ref 31.5–35.7)
MCV: 88 fL (ref 79–97)
Platelets: 341 10*3/uL (ref 150–450)
RBC: 4.48 x10E6/uL (ref 3.77–5.28)
RDW: 13.6 % (ref 11.7–15.4)
WBC: 10.7 10*3/uL (ref 3.4–10.8)

## 2021-03-26 LAB — MATERNIT21  PLUS CORE+ESS+SCA, BLOOD
11q23 deletion (Jacobsen): NOT DETECTED
15q11 deletion (PW Angelman): NOT DETECTED
1p36 deletion syndrome: NOT DETECTED
22q11 deletion (DiGeorge): NOT DETECTED
4p16 deletion(Wolf-Hirschhorn): NOT DETECTED
5p15 deletion (Cri-du-chat): NOT DETECTED
8q24 deletion (Langer-Giedion): NOT DETECTED
Fetal Fraction: 4
Monosomy X (Turner Syndrome): NOT DETECTED
Result (T21): NEGATIVE
Trisomy 13 (Patau syndrome): NEGATIVE
Trisomy 16: NOT DETECTED
Trisomy 18 (Edwards syndrome): NEGATIVE
Trisomy 21 (Down syndrome): NEGATIVE
Trisomy 22: NOT DETECTED
XXX (Triple X Syndrome): NOT DETECTED
XXY (Klinefelter Syndrome): NOT DETECTED
XYY (Jacobs Syndrome): NOT DETECTED

## 2021-03-26 LAB — CYTOLOGY - PAP: Diagnosis: NEGATIVE

## 2021-04-04 ENCOUNTER — Encounter
Admission: RE | Admit: 2021-04-04 | Discharge: 2021-04-04 | Disposition: A | Discharge status: Home / Self Care | Payer: Commercial Managed Care - PPO | Source: Ambulatory Visit | Attending: Anesthesiology | Admitting: Anesthesiology

## 2021-04-04 ENCOUNTER — Other Ambulatory Visit: Payer: Self-pay

## 2021-04-04 NOTE — Consult Note (Signed)
[facility] Anesthesia Consultation  Glenn Bussard [id number] DOB: 18-Nov-1990 DOA: 04/04/2021 PCP: Kathyrn Lass   Requesting physician: Marcelline Mates Date of consultation: 04/04/21 Reason for consultation: Obesity during pregnancy  CHIEF COMPLAINT:  Obesity during pregnancy  HISTORY OF PRESENT ILLNESS: Navreet Forino  is a 31 y.o. female with a known history of class 3 obesity.  PAST MEDICAL HISTORY:   Past Medical History:  Diagnosis Date   Morbid obesity with BMI of 60.0-69.9, adult ([place])    Trichomoniasis 01/2017    PAST SURGICAL HISTORY:  Past Surgical History:  Procedure Laterality Date   NO PAST SURGERIES      SOCIAL HISTORY:  Social History   Tobacco Use   Smoking status: Never   Smokeless tobacco: Never  Substance Use Topics   Alcohol use: Not Currently    Alcohol/week: 0.0 standard drinks    Comment: rarely    FAMILY HISTORY:  Family History  Problem Relation Age of Onset   Hypertension Mother    Hypertension Father    Diabetes Maternal Uncle    Diabetes Paternal Uncle    Hypertension Maternal Grandmother    Aneurysm Paternal Grandmother    Diabetes Paternal Grandfather    Brain cancer Paternal Grandfather    Prostate cancer Paternal Grandfather    Breast cancer Maternal Great-grandmother    Ovarian cancer Neg Hx    Colon cancer Neg Hx    Heart disease Neg Hx     DRUG ALLERGIES:  Allergies  Allergen Reactions   Penicillin G Other (See Comments)    REVIEW OF SYSTEMS:   RESPIRATORY: No cough, shortness of breath, wheezing.  CARDIOVASCULAR: No chest pain, orthopnea, edema.  HEMATOLOGY: No anemia, easy bruising or bleeding SKIN: No rash or lesion. NEUROLOGIC: No tingling, numbness, weakness.  PSYCHIATRY: No anxiety or depression.   MEDICATIONS AT HOME:  Prior to Admission medications   Medication Sig Start Date End Date Taking? Authorizing Provider  aspirin EC 81 MG tablet Take 1 tablet (81 mg total) by  mouth daily. Take after 12 weeks for prevention of preeclampssia later in pregnancy 03/22/21   Rubie Maid, MD  folic acid (FOLVITE) 1 MG tablet Take 1 tablet (1 mg total) by mouth daily. 02/13/21   Rubie Maid, MD  NIFEdipine (PROCARDIA-XL/NIFEDICAL-XL) 30 MG 24 hr tablet Take 1 tablet (30 mg total) by mouth daily. Can increase to twice a day as needed for symptomatic contractions 03/22/21   Rubie Maid, MD  Prenatal Vit-Fe Fumarate-FA (PRENATAL PO) Take by mouth.    [provider]  triamcinolone ointment (KENALOG) 0.1 % Apply topically. 01/09/21 01/09/22  [provider]      PHYSICAL EXAMINATION:   VITAL SIGNS: Last menstrual period 12/28/2020.  GENERAL:  31 y.o.-year-old patient no acute distress.  HEENT: Head atraumatic, normocephalic. Oropharynx and nasopharynx clear. MP IV, TM distance >3 cm, normal mouth opening. LUNGS: No use of accessory muscles of respiration.   EXTREMITIES: No pedal edema, cyanosis, or clubbing.  NEUROLOGIC: normal gait PSYCHIATRIC: The patient is alert and oriented x 3.  SKIN: No obvious rash, lesion, or ulcer.    IMPRESSION AND PLAN:   Lettie Munn  is a 31 y.o. female presenting with obesity during pregnancy. BMI is currently 67 at [redacted] weeks gestation.   We discussed analgesic options during labor including epidural analgesia. Discussed that in obesity there can be increased difficulty with epidural placement or even failure of successful epidural. We also discussed that even after successful epidural placement there is  increased risk of catheter migration out of the epidural space that would require catheter replacement. Discussed use of epidural vs spinal vs GA if cesarean delivery is required. Discussed increased risk of difficult intubation during pregnancy should an emergency cesarean delivery be required.   We discussed the limitations of a community hospital including but not limited to staffing, imaging, medications, and blood  products.  Considering her high BMI and poor airway, I suggested that the patient would be better served at a hospital with a higher level of care, such as [place] or [place].  The patient expressed understanding.    We also discussed the possibility of continuing to receive OB care from Dr. Marcelline Mates, with repeat evaluation at 35-36 weeks by anesthesia.  At that time, I told her, we would likely recommend transfer of OB care to a facility with a higher maternal level of care designation.  She understood, and said she would think about it and make a decision soon.  All questions answered and concerns addressed.

## 2021-04-06 ENCOUNTER — Ambulatory Visit: Payer: BLUE CROSS/BLUE SHIELD | Admitting: Cardiology

## 2021-04-11 ENCOUNTER — Telehealth: Payer: Self-pay

## 2021-04-11 NOTE — Telephone Encounter (Signed)
If bleeding continues or becomes heavier, can come into the office for further evaluation. If bleeding heavier than 1 pad per hour, should seek evaluation in the Emergency Room.

## 2021-04-11 NOTE — Telephone Encounter (Signed)
Patient called upset because when she went to the bathroom and wiped she had bright red blood. She states it was more than just spotting. I advised the patient to monitor her bleeding and if she started bleeding like a period flow and soiling a pad every 30 minutes along with severe cramping she she go be checked. She denies having any cramping. Please advise.

## 2021-04-12 NOTE — Telephone Encounter (Signed)
Sent patient a mychart message.

## 2021-04-19 ENCOUNTER — Encounter: Payer: Commercial Managed Care - PPO | Admitting: Obstetrics and Gynecology

## 2021-04-19 DIAGNOSIS — O0992 Supervision of high risk pregnancy, unspecified, second trimester: Secondary | ICD-10-CM

## 2021-04-19 DIAGNOSIS — Z1379 Encounter for other screening for genetic and chromosomal anomalies: Secondary | ICD-10-CM

## 2021-04-19 DIAGNOSIS — Z3A16 16 weeks gestation of pregnancy: Secondary | ICD-10-CM

## 2021-04-20 ENCOUNTER — Encounter: Payer: Commercial Managed Care - PPO | Admitting: Obstetrics and Gynecology

## 2021-04-20 DIAGNOSIS — O0992 Supervision of high risk pregnancy, unspecified, second trimester: Secondary | ICD-10-CM

## 2021-04-20 DIAGNOSIS — Z3A16 16 weeks gestation of pregnancy: Secondary | ICD-10-CM

## 2021-04-20 DIAGNOSIS — Z1379 Encounter for other screening for genetic and chromosomal anomalies: Secondary | ICD-10-CM

## 2021-04-23 ENCOUNTER — Ambulatory Visit (INDEPENDENT_AMBULATORY_CARE_PROVIDER_SITE_OTHER): Payer: Commercial Managed Care - PPO | Admitting: Obstetrics and Gynecology

## 2021-04-23 ENCOUNTER — Encounter: Payer: Self-pay | Admitting: Obstetrics and Gynecology

## 2021-04-23 ENCOUNTER — Other Ambulatory Visit: Payer: Self-pay

## 2021-04-23 VITALS — BP 134/90 | HR 96 | Wt >= 6400 oz

## 2021-04-23 DIAGNOSIS — O0992 Supervision of high risk pregnancy, unspecified, second trimester: Secondary | ICD-10-CM

## 2021-04-23 DIAGNOSIS — Z3A16 16 weeks gestation of pregnancy: Secondary | ICD-10-CM

## 2021-04-23 DIAGNOSIS — Z1379 Encounter for other screening for genetic and chromosomal anomalies: Secondary | ICD-10-CM

## 2021-04-23 LAB — POCT URINALYSIS DIPSTICK OB
Bilirubin, UA: NEGATIVE
Blood, UA: NEGATIVE
Glucose, UA: NEGATIVE
Ketones, UA: NEGATIVE
Leukocytes, UA: NEGATIVE
Nitrite, UA: NEGATIVE
Spec Grav, UA: >=1.03 — AB (ref 1.010–1.025)
Urobilinogen, UA: 0.2 E.U./dL
pH, UA: 6 (ref 5.0–8.0)

## 2021-04-23 NOTE — Progress Notes (Signed)
ROB. Patient states no fetal movement yet but having pressure. She also states feeling nauseated in the morning, reccommended B6.AFP ordered. ?Patient states no questions or concerns at this time.   ?

## 2021-04-23 NOTE — Progress Notes (Signed)
ROB: Complains of occasional nausea and vomiting.  Has not tried any of the recommended strategies or therapies- literature given.  aFP today.  Anatomic survey ultrasound next visit.  Anesthesia consult ordered -their recommendation is transfer of care at any time but before 36 weeks.  Patient taking vitamins and aspirin as directed.On Procardia for chronic hypertension. ?

## 2021-04-26 LAB — AFP, SERUM, OPEN SPINA BIFIDA
AFP MoM: 1.3
AFP Value: 33.4 ng/mL
Gest. Age on Collection Date: 16.6 weeks
Maternal Age At EDD: 29.8 yr
OSBR Risk 1 IN: 9606
Test Results:: NEGATIVE
Weight: 411 [lb_av]

## 2021-05-01 ENCOUNTER — Ambulatory Visit: Payer: Commercial Managed Care - PPO | Admitting: Cardiology

## 2021-05-07 ENCOUNTER — Other Ambulatory Visit: Payer: BLUE CROSS/BLUE SHIELD

## 2021-05-21 ENCOUNTER — Ambulatory Visit (INDEPENDENT_AMBULATORY_CARE_PROVIDER_SITE_OTHER): Payer: Commercial Managed Care - PPO

## 2021-05-21 DIAGNOSIS — O0992 Supervision of high risk pregnancy, unspecified, second trimester: Secondary | ICD-10-CM

## 2021-05-21 DIAGNOSIS — Z3A16 16 weeks gestation of pregnancy: Secondary | ICD-10-CM

## 2021-05-22 ENCOUNTER — Ambulatory Visit (INDEPENDENT_AMBULATORY_CARE_PROVIDER_SITE_OTHER): Payer: Commercial Managed Care - PPO | Admitting: Obstetrics and Gynecology

## 2021-05-22 ENCOUNTER — Other Ambulatory Visit: Payer: Commercial Managed Care - PPO

## 2021-05-22 ENCOUNTER — Encounter: Payer: Self-pay | Admitting: Obstetrics and Gynecology

## 2021-05-22 VITALS — BP 138/86 | HR 96 | Wt >= 6400 oz

## 2021-05-22 DIAGNOSIS — O10912 Unspecified pre-existing hypertension complicating pregnancy, second trimester: Secondary | ICD-10-CM

## 2021-05-22 DIAGNOSIS — O0992 Supervision of high risk pregnancy, unspecified, second trimester: Secondary | ICD-10-CM

## 2021-05-22 DIAGNOSIS — Z3A20 20 weeks gestation of pregnancy: Secondary | ICD-10-CM

## 2021-05-22 DIAGNOSIS — Z6841 Body Mass Index (BMI) 40.0 and over, adult: Secondary | ICD-10-CM

## 2021-05-22 NOTE — Progress Notes (Signed)
ROB: She is doing well. No new concerns. She just took BP medication not to long ago. ?

## 2021-05-22 NOTE — Patient Instructions (Signed)

## 2021-05-22 NOTE — Progress Notes (Signed)
ROB: Doing well, no issues. Discussed obesity in pregnancy, may no longer require transfer if she can maintain BMI under 65. Discussed referral to [facility] for nutritional management  BPs maintained on Procardia. Increased ASA to 2 tablets daily. Anatomy scan done yesterday, incomplete. Will refer to MFM for high risk scan and completion of anatomy. Also will place referral to Cardiology for baseline eval due to pre-existing HTN diagnosed in early pregnancy and BMI (patient at increased risk for cardiomyopathy). RTC in 4 weeks.  ?

## 2021-06-04 ENCOUNTER — Encounter: Payer: Self-pay | Admitting: Cardiology

## 2021-06-04 ENCOUNTER — Ambulatory Visit (INDEPENDENT_AMBULATORY_CARE_PROVIDER_SITE_OTHER): Payer: Commercial Managed Care - PPO | Admitting: Cardiology

## 2021-06-04 VITALS — BP 142/100 | HR 123 | Ht 66.0 in | Wt >= 6400 oz

## 2021-06-04 DIAGNOSIS — I10 Essential (primary) hypertension: Secondary | ICD-10-CM | POA: Diagnosis not present

## 2021-06-04 DIAGNOSIS — Z3490 Encounter for supervision of normal pregnancy, unspecified, unspecified trimester: Secondary | ICD-10-CM

## 2021-06-04 DIAGNOSIS — Z331 Pregnant state, incidental: Secondary | ICD-10-CM

## 2021-06-04 MED ORDER — NIFEDIPINE ER OSMOTIC RELEASE 60 MG PO TB24
60.0000 mg | ORAL_TABLET | Freq: Every day | ORAL | 1 refills | Status: DC
Start: 2021-06-04 — End: 2021-08-15

## 2021-06-04 NOTE — Patient Instructions (Signed)
Medication Instructions:  ? ?Your physician has recommended you make the following change in your medication:  ? ? INCREASE your Nifedipine to 60 MG once a day. ? ? ?*If you need a refill on your cardiac medications before your next appointment, please call your pharmacy* ? ? ?Lab Work: ? ?None ordered ? ?If you have labs (blood work) drawn today and your tests are completely normal, you will receive your results only by: ?MyChart Message (if you have MyChart) OR ?A paper copy in the mail ?If you have any lab test that is abnormal or we need to change your treatment, we will call you to review the results. ? ? ?Testing/Procedures: ? ?None ordered ? ? ? ?Follow-Up: ?At [facility], you and your health needs are our priority.  As part of our continuing mission to provide you with exceptional heart care, we have created designated Provider Care Teams.  These Care Teams include your primary Cardiologist (physician) and Advanced Practice Providers (APPs -  Physician Assistants and Nurse Practitioners) who all work together to provide you with the care you need, when you need it. ? ?We recommend signing up for the patient portal called "MyChart".  Sign up information is provided on this After Visit Summary.  MyChart is used to connect with patients for Virtual Visits (Telemedicine).  Patients are able to view lab/test results, encounter notes, upcoming appointments, etc.  Non-urgent messages can be sent to your provider as well.   ?To learn more about what you can do with MyChart, go to [email or web address].   ? ?Your next appointment:   ?6-8 week(s) ? ?The format for your next appointment:   ?In Person ? ?Provider:   ?You may see Debbe Odea, MD or one of the following Advanced Practice Providers on your designated Care Team:   ?Nicolasa Ducking, NP ?Eula Listen, PA-C ?Cadence Fransico Michael, PA-C  ? ? ?Other Instructions ? ? ?Important Information About Sugar ? ? ? ? ? ? ?

## 2021-06-04 NOTE — Progress Notes (Signed)
?Cardiology Office Note:   ? ?Date:  06/04/2021  ? ?ID:  Kim Ali, DOB 1990-02-26, MRN [id number] ? ?PCP:  Pcp, No ?  ?[facility] Providers ?Cardiologist:  None    ? ?Referring MD: Hildred Laser, MD  ? ?Chief Complaint  ?Patient presents with  ? New Patient (Initial Visit)  ?  Referred by OB for HTN. Meds reviewed verbally with patient.   ? ?Kim Ali is a 31 y.o. female who is being seen today for the evaluation of hypertension at the request of Hildred Laser, MD. ? ? ?History of Present Illness:   ? ?Kim Ali is a 31 y.o. female with a hx of hypertension, morbid obesity, currently [redacted] weeks pregnant who presents due to hypertension. ? ?Patient was told she has elevated blood pressures about 5 to 6 months ago.  Started on nifedipine 30 mg daily, tolerating medications without any issue.  Does not check her BP frequently at home, last check was about 2 weeks ago.  She states diastolic blood pressure usually elevated, does not remember numbers.  Denies chest pain, has shortness of breath when she overexerts herself.  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 ? ?Past Medical History:  ?Diagnosis Date  ? Morbid obesity with BMI of 60.0-69.9, adult (HCC)   ? Trichomoniasis 01/2017  ? ? ?Past Surgical History:  ?Procedure Laterality Date  ? NO PAST SURGERIES    ? ? ?Current Medications: ?Current Meds  ?Medication Sig  ? aspirin EC 81 MG tablet Take 1 tablet (81 mg total) by mouth daily. Take after 12 weeks for prevention of preeclampssia later in pregnancy  ? folic acid (FOLVITE) 1 MG tablet Take 1 tablet (1 mg total) by mouth daily.  ? Prenatal Vit-Fe Fumarate-FA (PRENATAL PO) Take by mouth.  ? triamcinolone ointment (KENALOG) 0.1 % Apply topically.  ? [DISCONTINUED] NIFEdipine (PROCARDIA-XL/NIFEDICAL-XL) 30 MG 24 hr tablet Take 1 tablet (30 mg total) by mouth daily. Can increase to twice a day as needed for symptomatic contractions  ?  ? ?Allergies:   Penicillin g  ? ?Social History   ? ?Socioeconomic History  ? Marital status: Single  ?  Spouse name: Not on file  ? Number of children: Not on file  ? Years of education: Not on file  ? Highest education level: Not on file  ?Occupational History  ? Not on file  ?Tobacco Use  ? Smoking status: Never  ? Smokeless tobacco: Never  ?Substance and Sexual Activity  ? Alcohol use: Not Currently  ? Drug use: No  ? Sexual activity: Yes  ?  Partners: Male  ?Other Topics Concern  ? Not on file  ?Social History Narrative  ? Not on file  ? ?Social Determinants of Health  ? ?Financial Resource Strain: Not on file  ?Food Insecurity: Not on file  ?Transportation Needs: Not on file  ?Physical Activity: Not on file  ?Stress: Not on file  ?Social Connections: Not on file  ?  ? ?Family History: ?The patient's family history includes Aneurysm in her paternal grandmother; Brain cancer in her paternal grandfather; Breast cancer in her maternal great-grandmother; Diabetes in her maternal uncle, paternal grandfather, and paternal uncle; Hypertension in her father, maternal grandmother, and mother; Prostate cancer in her paternal grandfather. There is no history of Ovarian cancer, Colon cancer, or Heart disease. ? ?ROS:   ?Please see the history of present illness.    ? All other systems reviewed and are negative. ? ?EKGs/Labs/Other Studies Reviewed:   ? ?  The following studies were reviewed today: ? ? ?EKG:  EKG is  ordered today.  The ekg ordered today demonstrates sinus tachycardia, ? ?Recent Labs: ?03/05/2021: TSH 1.800 ?03/22/2021: ALT 12; BUN 7; Creatinine, Ser 0.62; Hemoglobin 13.2; Platelets 341; Potassium 4.2; Sodium 135  ?Recent Lipid Panel ?No results found for: CHOL, TRIG, HDL, CHOLHDL, VLDL, LDLCALC, LDLDIRECT ? ? ?Risk Assessment/Calculations:   ? ?    ? ?Physical Exam:   ? ?VS:  BP (!) 142/100 (BP Location: Left Wrist, Patient Position: Sitting, Cuff Size: Normal)   Pulse (!) 123   Ht 5\' 6"  (1.676 m)   Wt (!) 413 lb (187.3 kg)   LMP 12/28/2020 (Exact Date)    SpO2 98%   BMI 66.66 kg/m?    ? ?Wt Readings from Last 3 Encounters:  ?06/04/21 (!) 413 lb (187.3 kg)  ?05/22/21 (!) 412 lb (186.9 kg)  ?04/23/21 (!) 411 lb 3.2 oz (186.5 kg)  ?  ? ?GEN:  Well nourished, well developed in no acute distress ?HEENT: Normal ?NECK: No JVD; No carotid bruits ?LYMPHATICS: No lymphadenopathy ?CARDIAC: RRR, no murmurs, rubs, gallops ?RESPIRATORY:  Clear to auscultation without rales, wheezing or rhonchi  ?ABDOMEN: Soft, non-tender, non-distended ?MUSCULOSKELETAL:  No edema; No deformity  ?SKIN: Warm and dry ?NEUROLOGIC:  Alert and oriented x 3 ?PSYCHIATRIC:  Normal affect  ? ?ASSESSMENT:   ? ?1. Primary hypertension   ?2. Morbid obesity (HCC)   ?3. Pregnancy, unspecified gestational age   ? ?PLAN:   ? ?In order of problems listed above: ? ?Hypertension, BP elevated.  Increase nifedipine to 60 mg daily.  Plan to titrate further if BP not well controlled.  Will also consider adding labetalol if heart rate stays elevated. ?Morbid obesity, low-calorie diet, weight loss recommended. ?[redacted] weeks pregnant, management as per GYN. ? ?Follow-up in 6 to 8 weeks. ? ?   ? ? ?Medication Adjustments/Labs and Tests Ordered: ?Current medicines are reviewed at length with the patient today.  Concerns regarding medicines are outlined above.  ?Orders Placed This Encounter  ?Procedures  ? EKG 12-Lead  ? ?Meds ordered this encounter  ?Medications  ? NIFEdipine (PROCARDIA XL/NIFEDICAL XL) 60 MG 24 hr tablet  ?  Sig: Take 1 tablet (60 mg total) by mouth daily.  ?  Dispense:  30 tablet  ?  Refill:  1  ? ? ?Patient Instructions  ?Medication Instructions:  ? ?Your physician has recommended you make the following change in your medication:  ? ? INCREASE your Nifedipine to 60 MG once a day. ? ? ?*If you need a refill on your cardiac medications before your next appointment, please call your pharmacy* ? ? ?Lab Work: ? ?None ordered ? ?If you have labs (blood work) drawn today and your tests are completely normal, you  will receive your results only by: ?MyChart Message (if you have MyChart) OR ?A paper copy in the mail ?If you have any lab test that is abnormal or we need to change your treatment, we will call you to review the results. ? ? ?Testing/Procedures: ? ?None ordered ? ? ? ?Follow-Up: ?[facility], you and your health needs are our priority.  As part of our continuing mission to provide you with exceptional heart care, we have created designated Provider Care Teams.  These Care Teams include your primary Cardiologist (physician) and Advanced Practice Providers (APPs -  Physician Assistants and Nurse Practitioners) who all work together to provide you with the care you need, when you need it. ? ?  We recommend signing up for the patient portal called "MyChart".  Sign up information is provided on this After Visit Summary.  MyChart is used to connect with patients for Virtual Visits (Telemedicine).  Patients are able to view lab/test results, encounter notes, upcoming appointments, etc.  Non-urgent messages can be sent to your provider as well.   ?To learn more about what you can do with MyChart, go to [email or web address].   ? ?Your next appointment:   ?6-8 week(s) ? ?The format for your next appointment:   ?In Person ? ?Provider:   ?You may see Debbe Odea, MD or one of the following Advanced Practice Providers on your designated Care Team:   ?Nicolasa Ducking, NP ?Eula Listen, PA-C ?Cadence Fransico Michael, PA-C  ? ? ?Other Instructions ? ? ?Important Information About Sugar ? ? ? ? ? ?  ? ?Signed, ?Debbe Odea, MD  ?06/04/2021 12:36 PM    ?[facility] ?

## 2021-06-12 ENCOUNTER — Encounter: Payer: Self-pay | Admitting: Obstetrics and Gynecology

## 2021-06-18 ENCOUNTER — Other Ambulatory Visit: Payer: Commercial Managed Care - PPO

## 2021-06-19 ENCOUNTER — Encounter: Payer: Self-pay | Admitting: Obstetrics and Gynecology

## 2021-06-19 ENCOUNTER — Ambulatory Visit (INDEPENDENT_AMBULATORY_CARE_PROVIDER_SITE_OTHER): Payer: Commercial Managed Care - PPO | Admitting: Obstetrics and Gynecology

## 2021-06-19 VITALS — BP 140/102 | HR 103 | Wt >= 6400 oz

## 2021-06-19 DIAGNOSIS — O0992 Supervision of high risk pregnancy, unspecified, second trimester: Secondary | ICD-10-CM

## 2021-06-19 DIAGNOSIS — Z3A24 24 weeks gestation of pregnancy: Secondary | ICD-10-CM

## 2021-06-19 LAB — POCT URINALYSIS DIPSTICK OB
Bilirubin, UA: NEGATIVE
Blood, UA: NEGATIVE
Glucose, UA: NEGATIVE
Ketones, UA: NEGATIVE
Leukocytes, UA: NEGATIVE
Nitrite, UA: NEGATIVE
Spec Grav, UA: >=1.03 — AB (ref 1.010–1.025)
Urobilinogen, UA: 0.2 E.U./dL
pH, UA: 6 (ref 5.0–8.0)

## 2021-06-19 NOTE — Progress Notes (Signed)
ROB. Patient states fetal movement. She states she is continuing to take aspirin once daily and procardia.   Patient states no questions or concerns at this time.  ? ?

## 2021-06-19 NOTE — Progress Notes (Signed)
ROB: Blood pressure is elevated today.  Her Procardia has just been increased to 60.  We will give it a chance to work over the next 2 weeks.  She reports daily fetal movement.  Lifestyle appointment and MFM ultrasound appointment next week.  Again discussed the cutoff of 65 BMI.  Patient is working on maintaining or losing a small amount of weight to stay below this. ?

## 2021-06-26 ENCOUNTER — Other Ambulatory Visit: Payer: Self-pay

## 2021-06-26 DIAGNOSIS — E559 Vitamin D deficiency, unspecified: Secondary | ICD-10-CM

## 2021-06-26 DIAGNOSIS — E668 Other obesity: Secondary | ICD-10-CM

## 2021-06-26 DIAGNOSIS — O10912 Unspecified pre-existing hypertension complicating pregnancy, second trimester: Secondary | ICD-10-CM

## 2021-06-28 ENCOUNTER — Ambulatory Visit: Payer: Commercial Managed Care - PPO | Attending: Maternal & Fetal Medicine

## 2021-06-28 ENCOUNTER — Encounter: Payer: Self-pay | Admitting: Dietician

## 2021-06-28 ENCOUNTER — Encounter: Payer: Commercial Managed Care - PPO | Attending: Obstetrics and Gynecology | Admitting: Dietician

## 2021-06-28 ENCOUNTER — Other Ambulatory Visit: Payer: Self-pay

## 2021-06-28 VITALS — Ht 66.0 in | Wt >= 6400 oz

## 2021-06-28 DIAGNOSIS — O99282 Endocrine, nutritional and metabolic diseases complicating pregnancy, second trimester: Secondary | ICD-10-CM | POA: Diagnosis not present

## 2021-06-28 DIAGNOSIS — Z3A26 26 weeks gestation of pregnancy: Secondary | ICD-10-CM | POA: Diagnosis not present

## 2021-06-28 DIAGNOSIS — O10912 Unspecified pre-existing hypertension complicating pregnancy, second trimester: Secondary | ICD-10-CM

## 2021-06-28 DIAGNOSIS — Z713 Dietary counseling and surveillance: Secondary | ICD-10-CM | POA: Insufficient documentation

## 2021-06-28 DIAGNOSIS — E559 Vitamin D deficiency, unspecified: Secondary | ICD-10-CM

## 2021-06-28 DIAGNOSIS — O99212 Obesity complicating pregnancy, second trimester: Secondary | ICD-10-CM | POA: Insufficient documentation

## 2021-06-28 DIAGNOSIS — O321XX0 Maternal care for breech presentation, not applicable or unspecified: Secondary | ICD-10-CM

## 2021-06-28 DIAGNOSIS — O0992 Supervision of high risk pregnancy, unspecified, second trimester: Secondary | ICD-10-CM | POA: Diagnosis present

## 2021-06-28 DIAGNOSIS — O10012 Pre-existing essential hypertension complicating pregnancy, second trimester: Secondary | ICD-10-CM

## 2021-06-28 DIAGNOSIS — Z363 Encounter for antenatal screening for malformations: Secondary | ICD-10-CM | POA: Insufficient documentation

## 2021-06-28 DIAGNOSIS — E668 Other obesity: Secondary | ICD-10-CM

## 2021-06-28 NOTE — Progress Notes (Signed)
Medical Nutrition Therapy: Visit start time: 1450  end time: 1545  ?Assessment:  Diagnosis: obesity with pregnancy ?Past medical history: hypertension, mildly elevated prior to pregnancy per patient ?Psychosocial issues/ stress concerns: none ? ?Preferred learning method:  ?Visual ?Hands-on ? ? ?Current weight: 414.8lbs with shoes Height: 5'6" BMI: 66.95 ? ?Medications, supplements: reconciled list in medical record ? ?Progress and evaluation:  ?Patient reports some weight gain during 1st part of pregnancy, but has recently been losing gradually.  ?Goal is to avoid weight gain at this time. ?Has young son at home ? ?Physical activity: no regular activity; works sedentary job full time ? ?Dietary Intake:  ?Usual eating pattern includes 2-3 meals and 1-2 snacks per day. ?Dining out frequency: 8-9 meals per week. ? ?Breakfast: skips about half the time; cereal or fruit; takeout  biscuit ?Snack: if skipped breakfast -- fruit/ small bag chips/ granola bar ?Lunch: varies-- brings leftovers from home; or occ out ie [employer], [place] [employer] sub; [employer]; occ door dash delivery ?Snack: none ?Supper: mom usually cooks ie chicken + green veg + sometimes starch ie beans ?Snack: sometimes fruit with son; cheesecake mini ?Beverages: water 4-5 glasses daily at work; juice; tea or soda ? ?Intervention:  ? ?Nutrition Care Education: ?  ?Basic nutrition: basic food groups; appropriate nutrient balance; appropriate meal and snack schedule; general nutrition guidelines    ?Weight control: weight maintenance goal for pregnancy; importance of low sugar and low fat choices; appropriate food portions; estimated energy needs at about 2000 kcal, provided guidance for 45%CHO, 25% pro, 30% fat ?Advanced nutrition:  recipe modification, cooking techniques, dining out, food label reading ?Pregnancy: adequate protein intake; inclusion of healthy food options ie low carb vegetables; food safety ?Hypertension: identifying high sodium foods,  increasing vegetables and/or fruits ? ?Additional Notes: ?Patient feels she needs to work on better portion control, and reducing frequency of restaurant meals.  ?She states she will work on dietary goals established together; she prefers to schedule follow up at a later time if needed. Encouraged her to call with any questions or concerns. ? ?Nutritional Diagnosis:  [place]-2.1 Inpaired nutrition utilization As related to hypertension.  As evidenced by blood pressure controlled with medications. ?[place]-3.3 Overweight/obesity As related to excess calories, inadequate physical activity.  As evidenced by patient with current BMI of 66.95. ? ? ?Education Materials given:  ?Plate Planner with food lists, sample meal pattern ?Sample menus ?Snacking handout ?Visit summary with goals/ instructions ? ? ?Learner/ who was taught:  ?Patient  ? ? ?Level of understanding: ?Verbalizes/ demonstrates competency ? ? ?Demonstrated degree of understanding via:   Teach back ?Learning barriers: ?None ? ?Willingness to learn/ readiness for change: ?Acceptance, ready for change ? ? ?Monitoring and Evaluation:  Dietary intake, exercise, BP control, and body weight ?     follow up: prn ? ?

## 2021-06-28 NOTE — Patient Instructions (Signed)
Control portions of foods, especially starchy foods and high fat foods/ fried foods.  ?Eat more supper meals at home, use leftovers for lunches.  ?Include some sandwich meals or salads for lunches to minimize restaurant meals. ?Try a lower sugar juice drink, such as Ocean Spray Diet 5, Tropicana 50, or Healthy Balance.  ?

## 2021-07-04 ENCOUNTER — Ambulatory Visit (INDEPENDENT_AMBULATORY_CARE_PROVIDER_SITE_OTHER): Payer: Commercial Managed Care - PPO | Admitting: Obstetrics and Gynecology

## 2021-07-04 ENCOUNTER — Encounter: Payer: Self-pay | Admitting: Obstetrics and Gynecology

## 2021-07-04 VITALS — BP 138/89 | HR 118 | Wt >= 6400 oz

## 2021-07-04 DIAGNOSIS — Z131 Encounter for screening for diabetes mellitus: Secondary | ICD-10-CM

## 2021-07-04 DIAGNOSIS — Z6841 Body Mass Index (BMI) 40.0 and over, adult: Secondary | ICD-10-CM

## 2021-07-04 DIAGNOSIS — Z3A26 26 weeks gestation of pregnancy: Secondary | ICD-10-CM

## 2021-07-04 DIAGNOSIS — Z113 Encounter for screening for infections with a predominantly sexual mode of transmission: Secondary | ICD-10-CM

## 2021-07-04 DIAGNOSIS — O0992 Supervision of high risk pregnancy, unspecified, second trimester: Secondary | ICD-10-CM

## 2021-07-04 DIAGNOSIS — O10913 Unspecified pre-existing hypertension complicating pregnancy, third trimester: Secondary | ICD-10-CM | POA: Insufficient documentation

## 2021-07-04 DIAGNOSIS — O10912 Unspecified pre-existing hypertension complicating pregnancy, second trimester: Secondary | ICD-10-CM

## 2021-07-04 DIAGNOSIS — Z130 Encounter for screening for diseases of the blood and blood-forming organs and certain disorders involving the immune mechanism: Secondary | ICD-10-CM

## 2021-07-04 LAB — POCT URINALYSIS DIPSTICK OB
Bilirubin, UA: NEGATIVE
Blood, UA: NEGATIVE
Glucose, UA: NEGATIVE
Ketones, UA: NEGATIVE
Leukocytes, UA: NEGATIVE
Nitrite, UA: NEGATIVE
POC,PROTEIN,UA: NEGATIVE
Spec Grav, UA: >=1.03 — AB (ref 1.010–1.025)
Urobilinogen, UA: 0.2 E.U./dL
pH, UA: 6 (ref 5.0–8.0)

## 2021-07-04 NOTE — Progress Notes (Signed)
ROB: Doing well, no complaints. Notes she had first appt with Nutritionist last week. Given meal plan (2K calorie diet). Had initial [place] with MFM on 5/11, growth 36/%ile.  Also with mild renal pyelectasis, will f/u in 4 weeks.  RTC in 2 weeks, for 1 hour glucola at that time.  ? ?

## 2021-07-04 NOTE — Progress Notes (Signed)
ROB: Patient is doing well. No new concerns. She has been taking her blood pressure medications as prescribed with no missed doses. She denies chest pain, palpitations, dizziness, or visual disturbances. ?

## 2021-07-19 ENCOUNTER — Other Ambulatory Visit: Payer: Commercial Managed Care - PPO

## 2021-07-19 ENCOUNTER — Ambulatory Visit (INDEPENDENT_AMBULATORY_CARE_PROVIDER_SITE_OTHER): Payer: Commercial Managed Care - PPO | Admitting: Obstetrics and Gynecology

## 2021-07-19 ENCOUNTER — Encounter: Payer: Self-pay | Admitting: Obstetrics and Gynecology

## 2021-07-19 VITALS — BP 130/90 | HR 108 | Wt >= 6400 oz

## 2021-07-19 DIAGNOSIS — Z3A29 29 weeks gestation of pregnancy: Secondary | ICD-10-CM

## 2021-07-19 DIAGNOSIS — Z131 Encounter for screening for diabetes mellitus: Secondary | ICD-10-CM

## 2021-07-19 DIAGNOSIS — Z130 Encounter for screening for diseases of the blood and blood-forming organs and certain disorders involving the immune mechanism: Secondary | ICD-10-CM

## 2021-07-19 DIAGNOSIS — Z113 Encounter for screening for infections with a predominantly sexual mode of transmission: Secondary | ICD-10-CM

## 2021-07-19 DIAGNOSIS — Z23 Encounter for immunization: Secondary | ICD-10-CM | POA: Diagnosis not present

## 2021-07-19 DIAGNOSIS — Z6841 Body Mass Index (BMI) 40.0 and over, adult: Secondary | ICD-10-CM

## 2021-07-19 DIAGNOSIS — O0993 Supervision of high risk pregnancy, unspecified, third trimester: Secondary | ICD-10-CM

## 2021-07-19 LAB — POCT URINALYSIS DIPSTICK OB
Bilirubin, UA: NEGATIVE
Blood, UA: NEGATIVE
Glucose, UA: NEGATIVE
Ketones, UA: NEGATIVE
Leukocytes, UA: NEGATIVE
Nitrite, UA: NEGATIVE
Spec Grav, UA: 1.025 (ref 1.010–1.025)
Urobilinogen, UA: 0.2 E.U./dL
pH, UA: 6 (ref 5.0–8.0)

## 2021-07-19 NOTE — Progress Notes (Signed)
ROB: Reports daily fetal movement.  No complaints.  1 hour GCT today.  Anesthesia consult 1 week.  Cultures next visit.

## 2021-07-19 NOTE — Progress Notes (Signed)
ROB: She is doing well, no complaints. BTC form signed and TDAP given.

## 2021-07-20 LAB — RPR: RPR Ser Ql: NONREACTIVE

## 2021-07-20 LAB — CBC
Hematocrit: 38.6 % (ref 34.0–46.6)
Hemoglobin: 12.9 g/dL (ref 11.1–15.9)
MCH: 28.9 pg (ref 26.6–33.0)
MCHC: 33.4 g/dL (ref 31.5–35.7)
MCV: 87 fL (ref 79–97)
Platelets: 396 10*3/uL (ref 150–450)
RBC: 4.46 x10E6/uL (ref 3.77–5.28)
RDW: 14.6 % (ref 11.7–15.4)
WBC: 9.9 10*3/uL (ref 3.4–10.8)

## 2021-07-20 LAB — GLUCOSE, 1 HOUR GESTATIONAL: Gestational Diabetes Screen: 102 mg/dL (ref 70–139)

## 2021-07-24 ENCOUNTER — Other Ambulatory Visit: Payer: Self-pay

## 2021-07-24 DIAGNOSIS — O10019 Pre-existing essential hypertension complicating pregnancy, unspecified trimester: Secondary | ICD-10-CM

## 2021-07-26 ENCOUNTER — Other Ambulatory Visit: Payer: Self-pay

## 2021-07-26 ENCOUNTER — Ambulatory Visit: Payer: Commercial Managed Care - PPO | Attending: Obstetrics

## 2021-07-26 DIAGNOSIS — O10019 Pre-existing essential hypertension complicating pregnancy, unspecified trimester: Secondary | ICD-10-CM | POA: Diagnosis not present

## 2021-07-26 DIAGNOSIS — O99213 Obesity complicating pregnancy, third trimester: Secondary | ICD-10-CM | POA: Insufficient documentation

## 2021-07-26 DIAGNOSIS — Z79899 Other long term (current) drug therapy: Secondary | ICD-10-CM | POA: Insufficient documentation

## 2021-07-26 DIAGNOSIS — O10013 Pre-existing essential hypertension complicating pregnancy, third trimester: Secondary | ICD-10-CM | POA: Insufficient documentation

## 2021-07-26 DIAGNOSIS — Z3A30 30 weeks gestation of pregnancy: Secondary | ICD-10-CM | POA: Diagnosis not present

## 2021-07-27 ENCOUNTER — Encounter: Payer: Self-pay | Admitting: Cardiology

## 2021-07-27 ENCOUNTER — Ambulatory Visit (INDEPENDENT_AMBULATORY_CARE_PROVIDER_SITE_OTHER): Payer: Commercial Managed Care - PPO | Admitting: Cardiology

## 2021-07-27 VITALS — BP 100/60 | HR 112 | Ht 66.0 in | Wt >= 6400 oz

## 2021-07-27 DIAGNOSIS — I10 Essential (primary) hypertension: Secondary | ICD-10-CM

## 2021-07-27 DIAGNOSIS — Z3490 Encounter for supervision of normal pregnancy, unspecified, unspecified trimester: Secondary | ICD-10-CM

## 2021-07-27 NOTE — Progress Notes (Signed)
Cardiology Office Note:    Date:  07/27/2021   ID:  Kim Proctorkeyah Lashawn Sabella, DOB 1991-02-16, MRN [id number]  PCP:  Pcp, No   [facility] Providers Cardiologist:  None     Referring MD: No ref. provider found   Chief Complaint  Patient presents with   Follow-up     History of Present Illness:    Kim Ali is a 31 y.o. female with a hx of hypertension, morbid obesity, currently [redacted] weeks pregnant who presents for follow-up.  Previously seen due to hypertension.  Previously on nifedipine 30 mg daily, this was increased to 60 mg.  Takes medications as prescribed.  Tolerating medication without any adverse effects.  Currently [redacted] weeks pregnant with a baby boy.  Blood pressures have improved since taking/increased dose.    Past Medical History:  Diagnosis Date   Morbid obesity with BMI of 60.0-69.9, adult (HCC)    Trichomoniasis 01/2017    Past Surgical History:  Procedure Laterality Date   NO PAST SURGERIES      Current Medications: Current Meds  Medication Sig   aspirin EC 81 MG tablet Take 1 tablet (81 mg total) by mouth daily. Take after 12 weeks for prevention of preeclampssia later in pregnancy   NIFEdipine (PROCARDIA XL/NIFEDICAL XL) 60 MG 24 hr tablet Take 1 tablet (60 mg total) by mouth daily.   Prenatal Vit-Fe Fumarate-FA (PRENATAL PO) Take 1 tablet by mouth daily.   triamcinolone ointment (KENALOG) 0.1 % Apply 1 application  topically as needed.     Allergies:   Penicillin g   Social History   Socioeconomic History   Marital status: Single    Spouse name: English as a second language teacherhaquelle   Number of children: Not on file   Years of education: Not on file   Highest education level: Not on file  Occupational History   Occupation: [employer]    Comment: [employer]  Tobacco Use   Smoking status: Never   Smokeless tobacco: Never  Vaping Use   Vaping Use: Never used  Substance and Sexual Activity   Alcohol use: Not Currently   Drug use: No   Sexual  activity: Yes    Partners: Male  Other Topics Concern   Not on file  Social History Narrative   Not on file   Social Determinants of Health   Financial Resource Strain: Not on file  Food Insecurity: Not on file  Transportation Needs: Not on file  Physical Activity: Not on file  Stress: Not on file  Social Connections: Not on file     Family History: The patient's family history includes Aneurysm in her paternal grandmother; Brain cancer in her paternal grandfather; Breast cancer in her maternal great-grandmother; Diabetes in her maternal uncle, paternal grandfather, and paternal uncle; Hypertension in her father, maternal grandmother, and mother; Prostate cancer in her paternal grandfather. There is no history of Ovarian cancer, Colon cancer, or Heart disease.  ROS:   Please see the history of present illness.     All other systems reviewed and are negative.  EKGs/Labs/Other Studies Reviewed:    The following studies were reviewed today:   EKG:  EKG is  ordered today.  The ekg ordered today demonstrates sinus tachycardia,  Recent Labs: 03/05/2021: TSH 1.800 03/22/2021: ALT 12; BUN 7; Creatinine, Ser 0.62; Potassium 4.2; Sodium 135 07/19/2021: Hemoglobin 12.9; Platelets 396  Recent Lipid Panel No results found for: "CHOL", "TRIG", "HDL", "CHOLHDL", "VLDL", "LDLCALC", "LDLDIRECT"   Risk Assessment/Calculations:  Physical Exam:    VS:  BP 100/60 (BP Location: Left Wrist, Patient Position: Sitting, Cuff Size: Large)   Pulse (!) 112   Ht 5\' 6"  (1.676 m)   Wt (!) 411 lb (186.4 kg)   LMP 12/28/2020 (Exact Date)   SpO2 99%   BMI 66.34 kg/m     Wt Readings from Last 3 Encounters:  07/27/21 (!) 411 lb (186.4 kg)  07/19/21 (!) 410 lb 12.8 oz (186.3 kg)  07/04/21 (!) 416 lb 1.6 oz (188.7 kg)     GEN:  Well nourished, well developed in no acute distress HEENT: Normal NECK: No JVD; No carotid bruits LYMPHATICS: No lymphadenopathy CARDIAC: RRR, no murmurs, rubs,  gallops RESPIRATORY:  Clear to auscultation without rales, wheezing or rhonchi  ABDOMEN: Soft, non-tender, non-distended MUSCULOSKELETAL:  No edema; No deformity  SKIN: Warm and dry NEUROLOGIC:  Alert and oriented x 3 PSYCHIATRIC:  Normal affect   ASSESSMENT:    1. Primary hypertension   2. Morbid obesity (HCC)   3. Pregnancy, unspecified gestational age     PLAN:    In order of problems listed above:  Hypertension, BP now controlled.  Continue nifedipine 60 mg daily.  Close monitoring of BP at home advised.  If BP becomes low after delivery of baby, consider reducing dose. Morbid obesity, low-calorie diet, weight loss recommended. [redacted] weeks pregnant, management as per GYN.  Follow-up in 5 months.     Medication Adjustments/Labs and Tests Ordered: Current medicines are reviewed at length with the patient today.  Concerns regarding medicines are outlined above.  No orders of the defined types were placed in this encounter.  No orders of the defined types were placed in this encounter.   Patient Instructions  Medication Instructions:  Your physician recommends that you continue on your current medications as directed. Please refer to the Current Medication list given to you today.  *If you need a refill on your cardiac medications before your next appointment, please call your pharmacy*   Lab Work: None ordered If you have labs (blood work) drawn today and your tests are completely normal, you will receive your results only by: MyChart Message (if you have MyChart) OR A paper copy in the mail If you have any lab test that is abnormal or we need to change your treatment, we will call you to review the results.   Testing/Procedures: None ordered   Follow-Up: At [facility], you and your health needs are our priority.  As part of our continuing mission to provide you with exceptional heart care, we have created designated Provider Care Teams.  These Care Teams  include your primary Cardiologist (physician) and Advanced Practice Providers (APPs -  Physician Assistants and Nurse Practitioners) who all work together to provide you with the care you need, when you need it.  We recommend signing up for the patient portal called "MyChart".  Sign up information is provided on this After Visit Summary.  MyChart is used to connect with patients for Virtual Visits (Telemedicine).  Patients are able to view lab/test results, encounter notes, upcoming appointments, etc.  Non-urgent messages can be sent to your provider as well.   To learn more about what you can do with MyChart, go to [facility].    Your next appointment:   5 month(s)  The format for your next appointment:   In Person  Provider:   You may see [email or web address], MD or one of the following Advanced Practice Providers on your designated  Care Team:   Nicolasa Ducking, NP Eula Listen, PA-C Cadence Fransico Michael, [place]    Other Instructions   Important Information About Sugar         Signed, Debbe Odea, MD  07/27/2021 9:00 AM    [facility]

## 2021-07-27 NOTE — Patient Instructions (Signed)
Medication Instructions:  Your physician recommends that you continue on your current medications as directed. Please refer to the Current Medication list given to you today.  *If you need a refill on your cardiac medications before your next appointment, please call your pharmacy*   Lab Work: None ordered If you have labs (blood work) drawn today and your tests are completely normal, you will receive your results only by: MyChart Message (if you have MyChart) OR A paper copy in the mail If you have any lab test that is abnormal or we need to change your treatment, we will call you to review the results.   Testing/Procedures: None ordered   Follow-Up: At [facility], you and your health needs are our priority.  As part of our continuing mission to provide you with exceptional heart care, we have created designated Provider Care Teams.  These Care Teams include your primary Cardiologist (physician) and Advanced Practice Providers (APPs -  Physician Assistants and Nurse Practitioners) who all work together to provide you with the care you need, when you need it.  We recommend signing up for the patient portal called "MyChart".  Sign up information is provided on this After Visit Summary.  MyChart is used to connect with patients for Virtual Visits (Telemedicine).  Patients are able to view lab/test results, encounter notes, upcoming appointments, etc.  Non-urgent messages can be sent to your provider as well.   To learn more about what you can do with MyChart, go to [email or web address].    Your next appointment:   5 month(s)  The format for your next appointment:   In Person  Provider:   You may see Debbe Odea, MD or one of the following Advanced Practice Providers on your designated Care Team:   Nicolasa Ducking, NP Eula Listen, PA-C Cadence Fransico Michael, [place]    Other Instructions   Important Information About Sugar

## 2021-08-02 ENCOUNTER — Encounter: Payer: Self-pay | Admitting: Obstetrics and Gynecology

## 2021-08-02 ENCOUNTER — Ambulatory Visit (INDEPENDENT_AMBULATORY_CARE_PROVIDER_SITE_OTHER): Payer: Commercial Managed Care - PPO | Admitting: Obstetrics and Gynecology

## 2021-08-02 VITALS — BP 162/83 | HR 104 | Wt >= 6400 oz

## 2021-08-02 DIAGNOSIS — O0993 Supervision of high risk pregnancy, unspecified, third trimester: Secondary | ICD-10-CM

## 2021-08-02 DIAGNOSIS — Z3A31 31 weeks gestation of pregnancy: Secondary | ICD-10-CM

## 2021-08-02 LAB — POCT URINALYSIS DIPSTICK OB
Bilirubin, UA: NEGATIVE
Blood, UA: NEGATIVE
Glucose, UA: NEGATIVE
Ketones, UA: NEGATIVE
Leukocytes, UA: NEGATIVE
Nitrite, UA: NEGATIVE
Spec Grav, UA: 1.025 (ref 1.010–1.025)
Urobilinogen, UA: 0.2 E.U./dL
pH, UA: 6 (ref 5.0–8.0)

## 2021-08-02 NOTE — Progress Notes (Unsigned)
ROB: She is doing well. No new concerns today. ?

## 2021-08-05 NOTE — Patient Instructions (Signed)
Third Trimester of Pregnancy ? ?The third trimester of pregnancy is from week 28 through week 40. This is also called months 7 through 9. This trimester is when your unborn baby (fetus) is growing very fast. At the end of the ninth month, the unborn baby is about 20 inches long. It weighs about 6-10 pounds. ?Body changes during your third trimester ?Your body continues to go through many changes during this time. The changes vary and generally return to normal after the baby is born. ?Physical changes ?Your weight will continue to increase. You may gain 25-35 pounds (11-16 kg) by the end of the pregnancy. If you are underweight, you may gain 28-40 lb (about 13-18 kg). If you are overweight, you may gain 15-25 lb (about 7-11 kg). ?You may start to get stretch marks on your hips, belly (abdomen), and breasts. ?Your breasts will continue to grow and may hurt. A yellow fluid (colostrum) may leak from your breasts. This is the first milk you are making for your baby. ?You may have changes in your hair. ?Your belly button may stick out. ?You may have more swelling in your hands, face, or ankles. ?Health changes ?You may have heartburn. ?You may have trouble pooping (constipation). ?You may get hemorrhoids. These are swollen veins in the butt that can itch or get painful. ?You may have swollen veins (varicose veins) in your legs. ?You may have more body aches in the pelvis, back, or thighs. ?You may have more tingling or numbness in your hands, arms, and legs. The skin on your belly may also feel numb. ?You may feel short of breath as your womb (uterus) gets bigger. ?Other changes ?You may pee (urinate) more often. ?You may have more problems sleeping. ?You may notice the unborn baby "dropping," or moving lower in your belly. ?You may have more discharge coming from your vagina. ?Your joints may feel loose, and you may have pain around your pelvic bone. ?Follow these instructions at home: ?Medicines ?Take over-the-counter  and prescription medicines only as told by your doctor. Some medicines are not safe during pregnancy. ?Take a prenatal vitamin that contains at least 600 micrograms (mcg) of folic acid. ?Eating and drinking ?Eat healthy meals that include: ?Fresh fruits and vegetables. ?Whole grains. ?Good sources of protein, such as meat, eggs, or tofu. ?Low-fat dairy products. ?Avoid raw meat and unpasteurized juice, milk, and cheese. These carry germs that can harm you and your baby. ?Eat 4 or 5 small meals rather than 3 large meals a day. ?You may need to take these actions to prevent or treat trouble pooping: ?Drink enough fluids to keep your pee (urine) pale yellow. ?Eat foods that are high in fiber. These include beans, whole grains, and fresh fruits and vegetables. ?Limit foods that are high in fat and sugar. These include fried or sweet foods. ?Activity ?Exercise only as told by your doctor. Stop exercising if you start to have cramps in your womb. ?Avoid heavy lifting. ?Do not exercise if it is too hot or too humid, or if you are in a place of great height (high altitude). ?If you choose to, you may have sex unless your doctor tells you not to. ?Relieving pain and discomfort ?Take breaks often, and rest with your legs raised (elevated) if you have leg cramps or low back pain. ?Take warm water baths (sitz baths) to soothe pain or discomfort caused by hemorrhoids. Use hemorrhoid cream if your doctor approves. ?Wear a good support bra if your breasts are   tender. ?If you develop bulging, swollen veins in your legs: ?Wear support hose as told by your doctor. ?Raise your feet for 15 minutes, 3-4 times a day. ?Limit salt in your food. ?Safety ?Talk to your doctor before traveling far distances. ?Do not use hot tubs, steam rooms, or saunas. ?Wear your seat belt at all times when you are in a car. ?Talk with your doctor if someone is hurting you or yelling at you a lot. ?Preparing for your baby's arrival ?To prepare for the arrival  of your baby: ?Take prenatal classes. ?Visit the hospital and tour the maternity area. ?Buy a rear-facing car seat. Learn how to install it in your car. ?Prepare the baby's room. Take out all pillows and stuffed animals from the baby's crib. ?General instructions ?Avoid cat litter boxes and soil used by cats. These carry germs that can cause harm to the baby and can cause a loss of your baby by miscarriage or stillbirth. ?Do not douche or use tampons. Do not use scented sanitary pads. ?Do not smoke or use any products that contain nicotine or tobacco. If you need help quitting, ask your doctor. ?Do not drink alcohol. ?Do not use herbal medicines, illegal drugs, or medicines that were not approved by your doctor. Chemicals in these products can affect your baby. ?Keep all follow-up visits. This is important. ?Where to find more information ?American Pregnancy Association: americanpregnancy.org ?American College of Obstetricians and Gynecologists: www.acog.org ?Office on Women's Health: womenshealth.gov/pregnancy ?Contact a doctor if: ?You have a fever. ?You have mild cramps or pressure in your lower belly. ?You have a nagging pain in your belly area. ?You vomit, or you have watery poop (diarrhea). ?You have bad-smelling fluid coming from your vagina. ?You have pain when you pee, or your pee smells bad. ?You have a headache that does not go away when you take medicine. ?You have changes in how you see, or you see spots in front of your eyes. ?Get help right away if: ?Your water breaks. ?You have regular contractions that are less than 5 minutes apart. ?You are spotting or bleeding from your vagina. ?You have very bad belly cramps or pain. ?You have trouble breathing. ?You have chest pain. ?You faint. ?You have not felt the baby move for the amount of time told by your doctor. ?You have new or increased pain, swelling, or redness in an arm or leg. ?Summary ?The third trimester is from week 28 through week 40 (months 7  through 9). This is the time when your unborn baby is growing very fast. ?During this time, your discomfort may increase as you gain weight and as your baby grows. ?Get ready for your baby to arrive by taking prenatal classes, buying a rear-facing car seat, and preparing the baby's room. ?Get help right away if you are bleeding from your vagina, you have chest pain and trouble breathing, or you have not felt the baby move for the amount of time told by your doctor. ?This information is not intended to replace advice given to you by your health care provider. Make sure you discuss any questions you have with your health care provider. ?Document Revised: 07/14/2019 Document Reviewed: 05/20/2019 ?Elsevier Patient Education ? 2023 Elsevier Inc. ? ?

## 2021-08-05 NOTE — Progress Notes (Signed)
ROB: Patient without major complaints today.  BPs noted to be elevated today, however patient was seen at Cardiologist last week with low-normal BPs.  Taking medications as prescribed. Will continue to monitor. May need cuff for home use. Small protein noted on today's UA. Has [place] next week with MFM.  For repeat Anesthesia consult (needs to be scheduled). Will need to begin antenatal surveillance starting next week. Will likely need transfer of care due to obesity.

## 2021-08-07 ENCOUNTER — Other Ambulatory Visit: Payer: Self-pay

## 2021-08-07 DIAGNOSIS — E668 Other obesity: Secondary | ICD-10-CM

## 2021-08-07 DIAGNOSIS — O10913 Unspecified pre-existing hypertension complicating pregnancy, third trimester: Secondary | ICD-10-CM

## 2021-08-09 ENCOUNTER — Other Ambulatory Visit: Payer: Self-pay

## 2021-08-09 ENCOUNTER — Ambulatory Visit: Payer: Commercial Managed Care - PPO | Attending: Maternal & Fetal Medicine

## 2021-08-09 DIAGNOSIS — O99213 Obesity complicating pregnancy, third trimester: Secondary | ICD-10-CM | POA: Diagnosis not present

## 2021-08-09 DIAGNOSIS — O10913 Unspecified pre-existing hypertension complicating pregnancy, third trimester: Secondary | ICD-10-CM

## 2021-08-09 DIAGNOSIS — Z3A32 32 weeks gestation of pregnancy: Secondary | ICD-10-CM

## 2021-08-09 DIAGNOSIS — O10013 Pre-existing essential hypertension complicating pregnancy, third trimester: Secondary | ICD-10-CM | POA: Insufficient documentation

## 2021-08-09 DIAGNOSIS — E668 Other obesity: Secondary | ICD-10-CM

## 2021-08-14 ENCOUNTER — Other Ambulatory Visit: Payer: Self-pay

## 2021-08-14 DIAGNOSIS — E668 Other obesity: Secondary | ICD-10-CM

## 2021-08-14 DIAGNOSIS — E559 Vitamin D deficiency, unspecified: Secondary | ICD-10-CM

## 2021-08-14 DIAGNOSIS — O10913 Unspecified pre-existing hypertension complicating pregnancy, third trimester: Secondary | ICD-10-CM

## 2021-08-15 ENCOUNTER — Ambulatory Visit (INDEPENDENT_AMBULATORY_CARE_PROVIDER_SITE_OTHER): Payer: Commercial Managed Care - PPO | Admitting: Obstetrics and Gynecology

## 2021-08-15 ENCOUNTER — Encounter: Payer: Self-pay | Admitting: Obstetrics and Gynecology

## 2021-08-15 ENCOUNTER — Other Ambulatory Visit: Payer: Self-pay | Admitting: Obstetrics and Gynecology

## 2021-08-15 ENCOUNTER — Other Ambulatory Visit: Payer: Self-pay

## 2021-08-15 VITALS — BP 126/97 | HR 115 | Wt >= 6400 oz

## 2021-08-15 DIAGNOSIS — Z3A32 32 weeks gestation of pregnancy: Secondary | ICD-10-CM

## 2021-08-15 DIAGNOSIS — O0993 Supervision of high risk pregnancy, unspecified, third trimester: Secondary | ICD-10-CM

## 2021-08-15 DIAGNOSIS — O10019 Pre-existing essential hypertension complicating pregnancy, unspecified trimester: Secondary | ICD-10-CM

## 2021-08-15 LAB — POCT URINALYSIS DIPSTICK OB
Bilirubin, UA: NEGATIVE
Blood, UA: NEGATIVE
Glucose, UA: NEGATIVE
Ketones, UA: NEGATIVE
Leukocytes, UA: NEGATIVE
Nitrite, UA: NEGATIVE
POC,PROTEIN,UA: NEGATIVE
Spec Grav, UA: 1.02 (ref 1.010–1.025)
Urobilinogen, UA: 0.2 E.U./dL
pH, UA: 5 (ref 5.0–8.0)

## 2021-08-15 MED ORDER — NIFEDIPINE ER OSMOTIC RELEASE 60 MG PO TB24: 60.0000 mg | ORAL_TABLET | Freq: Every day | ORAL | 1 refills | Status: DC

## 2021-08-15 NOTE — Progress Notes (Signed)
ROB. Patient states fetal movement with no pain or  pressure. Ultrasound w/ BPP scheduled for tomorrow.   Patient states no questions or concerns at this time.

## 2021-08-15 NOTE — Progress Notes (Signed)
ROB: No complaints.  Now taking 60 of Procardia.  Prescription refilled.  Ultrasound tomorrow for growth.  Anesthesia consult in 2 weeks. (They have previously recommended transfer after 35 weeks)

## 2021-08-16 ENCOUNTER — Encounter: Payer: Self-pay | Admitting: Obstetrics and Gynecology

## 2021-08-16 ENCOUNTER — Other Ambulatory Visit: Payer: Self-pay | Admitting: Obstetrics and Gynecology

## 2021-08-16 ENCOUNTER — Other Ambulatory Visit: Payer: Self-pay

## 2021-08-16 ENCOUNTER — Ambulatory Visit: Payer: Commercial Managed Care - PPO | Attending: Obstetrics and Gynecology

## 2021-08-16 DIAGNOSIS — Z3A33 33 weeks gestation of pregnancy: Secondary | ICD-10-CM | POA: Diagnosis not present

## 2021-08-16 DIAGNOSIS — E559 Vitamin D deficiency, unspecified: Secondary | ICD-10-CM | POA: Insufficient documentation

## 2021-08-16 DIAGNOSIS — Z3A32 32 weeks gestation of pregnancy: Secondary | ICD-10-CM

## 2021-08-16 DIAGNOSIS — O99283 Endocrine, nutritional and metabolic diseases complicating pregnancy, third trimester: Secondary | ICD-10-CM | POA: Diagnosis not present

## 2021-08-16 DIAGNOSIS — E668 Other obesity: Secondary | ICD-10-CM | POA: Insufficient documentation

## 2021-08-16 DIAGNOSIS — O10913 Unspecified pre-existing hypertension complicating pregnancy, third trimester: Secondary | ICD-10-CM | POA: Insufficient documentation

## 2021-08-16 DIAGNOSIS — O99213 Obesity complicating pregnancy, third trimester: Secondary | ICD-10-CM | POA: Diagnosis not present

## 2021-08-16 DIAGNOSIS — O10013 Pre-existing essential hypertension complicating pregnancy, third trimester: Secondary | ICD-10-CM

## 2021-08-16 DIAGNOSIS — O10019 Pre-existing essential hypertension complicating pregnancy, unspecified trimester: Secondary | ICD-10-CM

## 2021-08-22 ENCOUNTER — Telehealth: Payer: Self-pay

## 2021-08-22 NOTE — Telephone Encounter (Signed)
Left message for pt to call office back regarding appt we can schedule her on 7/18/ in the early AM and then weekly ROB.

## 2021-08-23 ENCOUNTER — Other Ambulatory Visit: Payer: Commercial Managed Care - PPO

## 2021-08-26 ENCOUNTER — Other Ambulatory Visit: Payer: Self-pay

## 2021-08-26 DIAGNOSIS — E669 Obesity, unspecified: Secondary | ICD-10-CM

## 2021-08-26 DIAGNOSIS — O10913 Unspecified pre-existing hypertension complicating pregnancy, third trimester: Secondary | ICD-10-CM

## 2021-08-26 DIAGNOSIS — E668 Other obesity: Secondary | ICD-10-CM

## 2021-08-28 ENCOUNTER — Inpatient Hospital Stay: Admission: RE | Admit: 2021-08-28 | Payer: Commercial Managed Care - PPO | Source: Ambulatory Visit

## 2021-08-28 ENCOUNTER — Other Ambulatory Visit: Payer: Self-pay

## 2021-08-28 ENCOUNTER — Ambulatory Visit: Payer: Commercial Managed Care - PPO | Attending: Obstetrics and Gynecology

## 2021-08-28 DIAGNOSIS — E668 Other obesity: Secondary | ICD-10-CM

## 2021-08-28 DIAGNOSIS — O10913 Unspecified pre-existing hypertension complicating pregnancy, third trimester: Secondary | ICD-10-CM

## 2021-08-28 DIAGNOSIS — Z6841 Body Mass Index (BMI) 40.0 and over, adult: Secondary | ICD-10-CM | POA: Diagnosis not present

## 2021-08-28 DIAGNOSIS — O10013 Pre-existing essential hypertension complicating pregnancy, third trimester: Secondary | ICD-10-CM | POA: Diagnosis present

## 2021-08-28 DIAGNOSIS — O99210 Obesity complicating pregnancy, unspecified trimester: Secondary | ICD-10-CM

## 2021-08-28 DIAGNOSIS — O99213 Obesity complicating pregnancy, third trimester: Secondary | ICD-10-CM | POA: Diagnosis not present

## 2021-08-28 DIAGNOSIS — O1493 Unspecified pre-eclampsia, third trimester: Secondary | ICD-10-CM | POA: Diagnosis not present

## 2021-08-28 DIAGNOSIS — Z3A34 34 weeks gestation of pregnancy: Secondary | ICD-10-CM

## 2021-08-29 ENCOUNTER — Ambulatory Visit (INDEPENDENT_AMBULATORY_CARE_PROVIDER_SITE_OTHER): Payer: Commercial Managed Care - PPO | Admitting: Obstetrics and Gynecology

## 2021-08-29 ENCOUNTER — Encounter: Payer: Self-pay | Admitting: Obstetrics and Gynecology

## 2021-08-29 VITALS — BP 136/99 | HR 104 | Wt >= 6400 oz

## 2021-08-29 DIAGNOSIS — O0993 Supervision of high risk pregnancy, unspecified, third trimester: Secondary | ICD-10-CM

## 2021-08-29 DIAGNOSIS — Z3A34 34 weeks gestation of pregnancy: Secondary | ICD-10-CM

## 2021-08-29 LAB — POCT URINALYSIS DIPSTICK OB
Bilirubin, UA: NEGATIVE
Blood, UA: NEGATIVE
Glucose, UA: NEGATIVE
Ketones, UA: NEGATIVE
Leukocytes, UA: NEGATIVE
Nitrite, UA: NEGATIVE
Spec Grav, UA: 1.025 (ref 1.010–1.025)
Urobilinogen, UA: 0.2 E.U./dL
pH, UA: 6 (ref 5.0–8.0)

## 2021-08-29 NOTE — Progress Notes (Signed)
ROB: She is doing well. No new concerns today. ?

## 2021-08-29 NOTE — Progress Notes (Signed)
ROB: Patient doing well, no issues. Will be transferring to [facility] to deliver at [facility] due to elevated BMI, has appointment next week. Seen by MFM yesterday, normal growth (22%ile) and AFI.  BPP 8/8. To continue weekly BPPs until delivery. Delivery recommended at 38 weeks. Continue Procardia ans aspirin. All questions answered. For 3rd trimester cultures next visit.

## 2021-09-03 ENCOUNTER — Other Ambulatory Visit: Payer: Self-pay

## 2021-09-03 DIAGNOSIS — E668 Other obesity: Secondary | ICD-10-CM

## 2021-09-03 DIAGNOSIS — O10913 Unspecified pre-existing hypertension complicating pregnancy, third trimester: Secondary | ICD-10-CM

## 2021-09-04 ENCOUNTER — Other Ambulatory Visit: Payer: Self-pay | Admitting: Obstetrics and Gynecology

## 2021-09-04 ENCOUNTER — Ambulatory Visit: Payer: Commercial Managed Care - PPO | Attending: Obstetrics and Gynecology

## 2021-09-04 ENCOUNTER — Other Ambulatory Visit (HOSPITAL_COMMUNITY)
Admission: RE | Admit: 2021-09-04 | Discharge: 2021-09-04 | Disposition: A | Discharge status: Home / Self Care | Payer: Commercial Managed Care - PPO | Source: Ambulatory Visit | Attending: Obstetrics & Gynecology | Admitting: Obstetrics & Gynecology

## 2021-09-04 ENCOUNTER — Ambulatory Visit (INDEPENDENT_AMBULATORY_CARE_PROVIDER_SITE_OTHER): Payer: Commercial Managed Care - PPO | Admitting: Obstetrics & Gynecology

## 2021-09-04 ENCOUNTER — Ambulatory Visit: Payer: Commercial Managed Care - PPO | Admitting: Obstetrics and Gynecology

## 2021-09-04 ENCOUNTER — Other Ambulatory Visit: Payer: Self-pay

## 2021-09-04 ENCOUNTER — Encounter: Payer: Self-pay | Admitting: Obstetrics & Gynecology

## 2021-09-04 VITALS — BP 157/103 | HR 109 | Wt >= 6400 oz

## 2021-09-04 DIAGNOSIS — O10913 Unspecified pre-existing hypertension complicating pregnancy, third trimester: Secondary | ICD-10-CM

## 2021-09-04 DIAGNOSIS — O0990 Supervision of high risk pregnancy, unspecified, unspecified trimester: Secondary | ICD-10-CM | POA: Diagnosis present

## 2021-09-04 DIAGNOSIS — O99213 Obesity complicating pregnancy, third trimester: Secondary | ICD-10-CM

## 2021-09-04 DIAGNOSIS — O10013 Pre-existing essential hypertension complicating pregnancy, third trimester: Secondary | ICD-10-CM | POA: Diagnosis not present

## 2021-09-04 DIAGNOSIS — Z3A35 35 weeks gestation of pregnancy: Secondary | ICD-10-CM

## 2021-09-04 DIAGNOSIS — E668 Other obesity: Secondary | ICD-10-CM

## 2021-09-04 DIAGNOSIS — Z3A37 37 weeks gestation of pregnancy: Secondary | ICD-10-CM | POA: Diagnosis not present

## 2021-09-04 DIAGNOSIS — O99210 Obesity complicating pregnancy, unspecified trimester: Secondary | ICD-10-CM

## 2021-09-04 DIAGNOSIS — O0993 Supervision of high risk pregnancy, unspecified, third trimester: Secondary | ICD-10-CM

## 2021-09-04 NOTE — Procedures (Signed)
Kim Ali 1990-05-22 [redacted]w[redacted]d  Fetus A Non-Stress Test Interpretation for 09/04/21  Indication: Chronic Hypertenstion  Fetal Heart Rate A Mode: External Baseline Rate (A): 135 bpm Variability: Moderate Accelerations: 15 x 15 Decelerations: None Multiple birth?: No  Uterine Activity Mode: Toco  Interpretation (Fetal Testing) Nonstress Test Interpretation: Reactive (Per Dr. Judeth Cornfield)

## 2021-09-04 NOTE — Patient Instructions (Signed)
Return to office for any scheduled appointments. Call the office or go to the MAU at [facility] [place] if: You begin to have strong, frequent contractions Your water breaks.  Sometimes it is a big gush of fluid, sometimes it is just a trickle that keeps getting your underwear wet or running down your legs You have vaginal bleeding.  It is normal to have a small amount of spotting if your cervix was checked.  You do not feel your baby moving like normal.  If you do not, get something to eat and drink and lay down and focus on feeling your baby move.   If your baby is still not moving like normal, you should call the office or go to MAU. Any other obstetric concerns.  

## 2021-09-04 NOTE — Progress Notes (Signed)
157/103 

## 2021-09-04 NOTE — Progress Notes (Signed)
   PRENATAL VISIT NOTE  Subjective:  Kim Ali is a 31 y.o. G2P1001 at [redacted]w[redacted]d being seen today for transfer of prenatal care from [facility] due to high BMI.  She is currently monitored for the following issues for this high-risk pregnancy and has Morbid obesity with BMI of 60.0-69.9, adult (HCC); Supervision of high risk pregnancy, antepartum; Vitamin D deficiency; Pre-existing hypertension affecting pregnancy in third trimester; and Maternal morbid obesity, antepartum (HCC) on their problem list.  Patient reports no complaints. Patient denies any headaches, visual symptoms, RUQ/epigastric pain or other concerning symptoms. Contractions: Not present. Vag. Bleeding: None.  Movement: Present. Denies leaking of fluid.   The following portions of the patient's history were reviewed and updated as appropriate: allergies, current medications, past family history, past medical history, past social history, past surgical history and problem list.   Objective:   Vitals:   09/04/21 1106  BP: (!) 157/103  Pulse: (!) 109  Weight: (!) 414 lb (187.8 kg)    Fetal Status: Fetal Heart Rate (bpm): 148   Movement: Present  Presentation: Vertex  General:  Alert, oriented and cooperative. Patient is in no acute distress.  Skin: Skin is warm and dry. No rash noted.   Cardiovascular: Normal heart rate noted  Respiratory: Normal respiratory effort, no problems with respiration noted  Abdomen: Soft, gravid, appropriate for gestational age.  Pain/Pressure: Absent     Pelvic: Cervical exam performed in the presence of a chaperone Dilation: 3 Effacement (%): 50 Station: -2, cultures done.  Extremities: Normal range of motion.  Edema: Moderate pitting, indentation subsides rapidly  Mental Status: Normal mood and affect. Normal behavior. Normal judgment and thought content.   Assessment and Plan:  Pregnancy: G2P1001 at [redacted]w[redacted]d 1. Pre-existing hypertension affecting pregnancy in third trimester on  medication Just took medication right before this appointment. No symptoms currently. Will check labs, will follow up results and manage accordingly.  Having MFM scan later today. She was told if BP still concerning, she may be sent for evaluation. Continue antenatal testing and scans as per MFM. Delivery dependent on her BP control and fetal status, will also follow MFM recommendations. - Comprehensive metabolic panel - CBC - Protein / creatinine ratio, urine  2. Maternal morbid obesity, antepartum (HCC) BMI 67. Will deliver at [facility]. The nature of [facility] with multiple MDs and other Advanced Practice Providers was explained to patient; also emphasized that residents, students are part of our team.  3. [redacted] weeks gestation of pregnancy 4. Supervision of high risk pregnancy, antepartum Pelvic cultures done today, will follow up results and manage accordingly. - Strep Gp B Culture+Rflx - Cervicovaginal ancillary only Preterm labor symptoms and general obstetric precautions including but not limited to vaginal bleeding, contractions, leaking of fluid and fetal movement were reviewed in detail with the patient. Please refer to After Visit Summary for other counseling recommendations.   No follow-ups on file.  Future Appointments  Date Time Provider Department Center  09/04/2021  4:00 PM [facility]  09/11/2021  2:00 PM [facility]  09/18/2021 10:30 AM [facility]  12/28/2021  9:00 AM Agbor-Etang, Arlys John, MD [facility]    Jaynie Collins, MD

## 2021-09-05 ENCOUNTER — Encounter: Payer: Commercial Managed Care - PPO | Admitting: Obstetrics and Gynecology

## 2021-09-05 LAB — COMPREHENSIVE METABOLIC PANEL
ALT: 13 IU/L (ref 0–32)
AST: 14 IU/L (ref 0–40)
Albumin/Globulin Ratio: 1.1 — ABNORMAL LOW (ref 1.2–2.2)
Albumin: 3.3 g/dL — ABNORMAL LOW (ref 4.0–5.0)
Alkaline Phosphatase: 154 IU/L — ABNORMAL HIGH (ref 44–121)
BUN/Creatinine Ratio: 12 (ref 9–23)
BUN: 7 mg/dL (ref 6–20)
Bilirubin Total: 0.4 mg/dL (ref 0.0–1.2)
CO2: 18 mmol/L — ABNORMAL LOW (ref 20–29)
Calcium: 9.1 mg/dL (ref 8.7–10.2)
Chloride: 106 mmol/L (ref 96–106)
Creatinine, Ser: 0.6 mg/dL (ref 0.57–1.00)
Globulin, Total: 3 g/dL (ref 1.5–4.5)
Glucose: 87 mg/dL (ref 70–99)
Potassium: 4.2 mmol/L (ref 3.5–5.2)
Sodium: 139 mmol/L (ref 134–144)
Total Protein: 6.3 g/dL (ref 6.0–8.5)
eGFR: 125 mL/min/{1.73_m2} (ref >59)

## 2021-09-05 LAB — CBC
Hematocrit: 39 % (ref 34.0–46.6)
Hemoglobin: 13.1 g/dL (ref 11.1–15.9)
MCH: 29.1 pg (ref 26.6–33.0)
MCHC: 33.6 g/dL (ref 31.5–35.7)
MCV: 87 fL (ref 79–97)
Platelets: 354 10*3/uL (ref 150–450)
RBC: 4.5 x10E6/uL (ref 3.77–5.28)
RDW: 15.3 % (ref 11.7–15.4)
WBC: 10.5 10*3/uL (ref 3.4–10.8)

## 2021-09-05 LAB — CERVICOVAGINAL ANCILLARY ONLY
Chlamydia: NEGATIVE
Comment: NEGATIVE
Comment: NORMAL
Neisseria Gonorrhea: NEGATIVE

## 2021-09-06 LAB — PROTEIN / CREATININE RATIO, URINE
Creatinine, Urine: 202.9 mg/dL
Protein, Ur: 36.1 mg/dL
Protein/Creat Ratio: 178 mg/g creat (ref 0–200)

## 2021-09-08 LAB — STREP GP B CULTURE+RFLX: Strep Gp B Culture+Rflx: NEGATIVE

## 2021-09-10 ENCOUNTER — Other Ambulatory Visit: Payer: Self-pay

## 2021-09-10 DIAGNOSIS — O10913 Unspecified pre-existing hypertension complicating pregnancy, third trimester: Secondary | ICD-10-CM

## 2021-09-10 DIAGNOSIS — E668 Other obesity: Secondary | ICD-10-CM

## 2021-09-11 ENCOUNTER — Ambulatory Visit: Payer: Commercial Managed Care - PPO | Attending: Obstetrics and Gynecology

## 2021-09-11 ENCOUNTER — Other Ambulatory Visit: Payer: Self-pay

## 2021-09-11 ENCOUNTER — Ambulatory Visit: Payer: Commercial Managed Care - PPO | Admitting: Obstetrics and Gynecology

## 2021-09-11 DIAGNOSIS — Z3A36 36 weeks gestation of pregnancy: Secondary | ICD-10-CM | POA: Insufficient documentation

## 2021-09-11 DIAGNOSIS — Z6841 Body Mass Index (BMI) 40.0 and over, adult: Secondary | ICD-10-CM | POA: Diagnosis not present

## 2021-09-11 DIAGNOSIS — O99213 Obesity complicating pregnancy, third trimester: Secondary | ICD-10-CM | POA: Diagnosis not present

## 2021-09-11 DIAGNOSIS — E668 Other obesity: Secondary | ICD-10-CM

## 2021-09-11 DIAGNOSIS — O10013 Pre-existing essential hypertension complicating pregnancy, third trimester: Secondary | ICD-10-CM | POA: Diagnosis present

## 2021-09-11 DIAGNOSIS — O10913 Unspecified pre-existing hypertension complicating pregnancy, third trimester: Secondary | ICD-10-CM

## 2021-09-11 DIAGNOSIS — E669 Obesity, unspecified: Secondary | ICD-10-CM

## 2021-09-11 NOTE — Procedures (Signed)
Melanny Wire January 11, 1991 [redacted]w[redacted]d  Fetus A Non-Stress Test Interpretation for 09/11/21  Indication: Chronic Hypertenstion  Fetal Heart Rate A Mode: External Baseline Rate (A): 140 bpm Variability: Moderate Accelerations: 15 x 15 Decelerations: None Multiple birth?: No  Uterine Activity Mode: Toco  Interpretation (Fetal Testing) Nonstress Test Interpretation: Reactive (Per Dr. Rubbie Battiest)

## 2021-09-12 ENCOUNTER — Ambulatory Visit (INDEPENDENT_AMBULATORY_CARE_PROVIDER_SITE_OTHER): Payer: Commercial Managed Care - PPO | Admitting: Family Medicine

## 2021-09-12 VITALS — BP 145/90 | HR 125 | Wt >= 6400 oz

## 2021-09-12 DIAGNOSIS — O10913 Unspecified pre-existing hypertension complicating pregnancy, third trimester: Secondary | ICD-10-CM

## 2021-09-12 DIAGNOSIS — O0990 Supervision of high risk pregnancy, unspecified, unspecified trimester: Secondary | ICD-10-CM | POA: Diagnosis not present

## 2021-09-12 DIAGNOSIS — O99210 Obesity complicating pregnancy, unspecified trimester: Secondary | ICD-10-CM | POA: Diagnosis not present

## 2021-09-12 DIAGNOSIS — Z3A36 36 weeks gestation of pregnancy: Secondary | ICD-10-CM

## 2021-09-12 NOTE — Progress Notes (Signed)
   PRENATAL VISIT NOTE  Subjective:  Kim Ali is a 31 y.o. G2P1001 at [redacted]w[redacted]d being seen today for ongoing prenatal care.  She is currently monitored for the following issues for this high-risk pregnancy and has Morbid obesity with BMI of 60.0-69.9, adult (HCC); Supervision of high risk pregnancy, antepartum; Vitamin D deficiency; Pre-existing hypertension affecting pregnancy in third trimester; and Maternal morbid obesity, antepartum (HCC) on their problem list.  Patient reports no complaints.  Contractions: Not present. Vag. Bleeding: None.  Movement: Present. Denies leaking of fluid.   The following portions of the patient's history were reviewed and updated as appropriate: allergies, current medications, past family history, past medical history, past social history, past surgical history and problem list.   Objective:   Vitals:   09/12/21 1458  BP: (!) 145/90  Pulse: (!) 125  Weight: (!) 416 lb (188.7 kg)    Fetal Status: Fetal Heart Rate (bpm): 154 Fundal Height: 41 cm Movement: Present  Presentation: Vertex  General:  Alert, oriented and cooperative. Patient is in no acute distress.  Skin: Skin is warm and dry. No rash noted.   Cardiovascular: Normal heart rate noted  Respiratory: Normal respiratory effort, no problems with respiration noted  Abdomen: Soft, gravid, appropriate for gestational age.  Pain/Pressure: Absent     Pelvic: Cervical exam performed in the presence of a chaperone Dilation: 4 Effacement (%): 50 Station: -2  Extremities: Normal range of motion.  Edema: Moderate pitting, indentation subsides rapidly  Mental Status: Normal mood and affect. Normal behavior. Normal judgment and thought content.   Assessment and Plan:  Pregnancy: G2P1001 at [redacted]w[redacted]d 1. Supervision of high risk pregnancy, antepartum Continue prenatal care.   2. Pre-existing hypertension affecting pregnancy in third trimester BP is ok on Procardia 60 mg daily Growth ok In testing  with MFM On ASA IOL @ 38 weeks, Orders placed  3. Maternal morbid obesity, antepartum (HCC) For delivery at [facility]  Term labor symptoms and general obstetric precautions including but not limited to vaginal bleeding, contractions, leaking of fluid and fetal movement were reviewed in detail with the patient. Please refer to After Visit Summary for other counseling recommendations.   Return in 1 week (on 09/19/2021).  Future Appointments  Date Time Provider Department Center  09/18/2021  3:00 PM [facility]  12/28/2021  9:00 AM Agbor-Etang, Arlys John, MD [facility]    Reva Bores, MD

## 2021-09-12 NOTE — Progress Notes (Signed)
ROB [redacted]w[redacted]d  CC: None   Pt would like cervix check.

## 2021-09-13 ENCOUNTER — Other Ambulatory Visit: Payer: Self-pay

## 2021-09-13 ENCOUNTER — Telehealth (HOSPITAL_COMMUNITY): Payer: Self-pay | Admitting: *Deleted

## 2021-09-13 ENCOUNTER — Encounter (HOSPITAL_COMMUNITY): Payer: Self-pay

## 2021-09-13 DIAGNOSIS — O10913 Unspecified pre-existing hypertension complicating pregnancy, third trimester: Secondary | ICD-10-CM

## 2021-09-13 DIAGNOSIS — E668 Other obesity: Secondary | ICD-10-CM

## 2021-09-13 NOTE — Telephone Encounter (Signed)
Preadmission screen  

## 2021-09-14 ENCOUNTER — Other Ambulatory Visit: Payer: Self-pay | Admitting: Advanced Practice Midwife

## 2021-09-14 ENCOUNTER — Encounter (HOSPITAL_COMMUNITY): Payer: Self-pay | Admitting: *Deleted

## 2021-09-14 ENCOUNTER — Telehealth (HOSPITAL_COMMUNITY): Payer: Self-pay | Admitting: *Deleted

## 2021-09-14 NOTE — Telephone Encounter (Signed)
Preadmission screen  

## 2021-09-17 ENCOUNTER — Encounter (HOSPITAL_COMMUNITY): Payer: Self-pay | Admitting: Family Medicine

## 2021-09-17 ENCOUNTER — Inpatient Hospital Stay (HOSPITAL_COMMUNITY)
Admission: AD | Admit: 2021-09-17 | Discharge: 2021-09-17 | Disposition: A | Discharge status: Home / Self Care | Payer: Commercial Managed Care - PPO | Attending: Family Medicine | Admitting: Family Medicine

## 2021-09-17 ENCOUNTER — Telehealth: Payer: Self-pay

## 2021-09-17 ENCOUNTER — Other Ambulatory Visit: Payer: Self-pay

## 2021-09-17 DIAGNOSIS — O471 False labor at or after 37 completed weeks of gestation: Secondary | ICD-10-CM | POA: Diagnosis present

## 2021-09-17 DIAGNOSIS — O10919 Unspecified pre-existing hypertension complicating pregnancy, unspecified trimester: Secondary | ICD-10-CM

## 2021-09-17 DIAGNOSIS — O10913 Unspecified pre-existing hypertension complicating pregnancy, third trimester: Secondary | ICD-10-CM | POA: Diagnosis not present

## 2021-09-17 DIAGNOSIS — Z79899 Other long term (current) drug therapy: Secondary | ICD-10-CM | POA: Diagnosis not present

## 2021-09-17 DIAGNOSIS — Z3A37 37 weeks gestation of pregnancy: Secondary | ICD-10-CM | POA: Insufficient documentation

## 2021-09-17 LAB — URINALYSIS, ROUTINE W REFLEX MICROSCOPIC
Bilirubin Urine: NEGATIVE
Glucose, UA: NEGATIVE mg/dL
Hgb urine dipstick: NEGATIVE
Ketones, ur: NEGATIVE mg/dL
Nitrite: NEGATIVE
Protein, ur: 30 mg/dL — AB
Specific Gravity, Urine: 1.026 (ref 1.005–1.030)
pH: 5 (ref 5.0–8.0)

## 2021-09-17 NOTE — MAU Provider Note (Signed)
S: Ms. Kim Ali is a 30 y.o. G2P1001 at [redacted]w[redacted]d  who presents to MAU today complaining contractions q every couple of minutes since 0830. She denies vaginal bleeding. She denies LOF. She reports normal fetal movement.    She has CHTN and takes Procardia daily. She states she took it this morning and immediately threw it up. She denies any HA, visual changes or epigastric pain.  O: BP (!) 147/109   Pulse (!) 101   Temp 98.1 F (36.7 C) (Oral)   Resp 18   Ht 5\' 6"  (1.676 m)   Wt (!) 187.9 kg   LMP 12/28/2020 (Exact Date)   SpO2 98%   BMI 66.85 kg/m  GENERAL: Well-developed, well-nourished female in no acute distress.  HEAD: Normocephalic, atraumatic.  CHEST: Normal effort of breathing, regular heart rate ABDOMEN: Soft, nontender, gravid  Cervical exam:  Dilation: 4 Effacement (%): 70 Cervical Position: Posterior, Middle Station: -2 Exam by:: jolynn   Fetal Monitoring: Baseline: 135 Variability: moderate Accelerations: 15x15 Decelerations: none Contractions: 2 total   A: 1. False labor after 37 completed weeks of gestation   2. [redacted] weeks gestation of pregnancy   3. Chronic hypertension affecting pregnancy    CNM emphasized importance of taking procardia to control HTN. Encouraged eating before taking medications  P: -Discharge home in stable condition -Labor precautions discussed -Patient advised to follow-up with OB as scheduled for prenatal care -Patient may return to MAU as needed or if her condition were to change or worsen   [phone], Rolm Bookbinder 09/17/2021 1:55 PM

## 2021-09-17 NOTE — Discharge Instructions (Signed)

## 2021-09-17 NOTE — Telephone Encounter (Signed)
TC from pt regarding discomfort and contractions since 8am apart. +FM, no leaking of fluid, no vaginal bleeding. Pt was contracting while on the phone had difficulty speaking due to contractions  Pt advised to go to MAU ASAP We do not have a am provider in the office at this time to check pt cervix.  Pt agreeable and voiced understanding.

## 2021-09-17 NOTE — MAU Note (Signed)
Kim Ali is a 31 y.o. at [redacted]w[redacted]d here in MAU reporting: has been feeling pressure since about 0830. It is intermittent and happens every couple of minutes. States her abdomen gets tight. No bleeding or LOF. +FM  Onset of complaint: today  Pain score: 6/10  Vitals:   09/17/21 1259  BP: (!) 151/106  Pulse: 95  Resp: 18  Temp: 98.1 F (36.7 C)  SpO2: 96%     FHT:142  Lab orders placed from triage: UA

## 2021-09-18 ENCOUNTER — Ambulatory Visit: Payer: Commercial Managed Care - PPO | Attending: Obstetrics and Gynecology

## 2021-09-18 ENCOUNTER — Other Ambulatory Visit: Payer: Self-pay

## 2021-09-18 ENCOUNTER — Other Ambulatory Visit: Payer: Commercial Managed Care - PPO

## 2021-09-18 DIAGNOSIS — O99213 Obesity complicating pregnancy, third trimester: Secondary | ICD-10-CM | POA: Diagnosis not present

## 2021-09-18 DIAGNOSIS — E668 Other obesity: Secondary | ICD-10-CM

## 2021-09-18 DIAGNOSIS — O10013 Pre-existing essential hypertension complicating pregnancy, third trimester: Secondary | ICD-10-CM | POA: Diagnosis not present

## 2021-09-18 DIAGNOSIS — Z6841 Body Mass Index (BMI) 40.0 and over, adult: Secondary | ICD-10-CM | POA: Diagnosis not present

## 2021-09-18 DIAGNOSIS — Z3A37 37 weeks gestation of pregnancy: Secondary | ICD-10-CM | POA: Insufficient documentation

## 2021-09-18 DIAGNOSIS — O10913 Unspecified pre-existing hypertension complicating pregnancy, third trimester: Secondary | ICD-10-CM

## 2021-09-21 ENCOUNTER — Inpatient Hospital Stay (HOSPITAL_COMMUNITY)
Admission: AD | Admit: 2021-09-21 | Discharge: 2021-09-23 | DRG: 807 | Disposition: A | Discharge status: Home / Self Care | Payer: Commercial Managed Care - PPO | Attending: Obstetrics and Gynecology | Admitting: Obstetrics and Gynecology

## 2021-09-21 ENCOUNTER — Inpatient Hospital Stay (HOSPITAL_COMMUNITY): Payer: Commercial Managed Care - PPO

## 2021-09-21 ENCOUNTER — Encounter (HOSPITAL_COMMUNITY): Payer: Self-pay | Admitting: Family Medicine

## 2021-09-21 DIAGNOSIS — Z3A38 38 weeks gestation of pregnancy: Secondary | ICD-10-CM

## 2021-09-21 DIAGNOSIS — O10919 Unspecified pre-existing hypertension complicating pregnancy, unspecified trimester: Secondary | ICD-10-CM | POA: Diagnosis present

## 2021-09-21 DIAGNOSIS — O99214 Obesity complicating childbirth: Secondary | ICD-10-CM | POA: Diagnosis present

## 2021-09-21 DIAGNOSIS — O1002 Pre-existing essential hypertension complicating childbirth: Principal | ICD-10-CM | POA: Diagnosis present

## 2021-09-21 DIAGNOSIS — Z6841 Body Mass Index (BMI) 40.0 and over, adult: Secondary | ICD-10-CM

## 2021-09-21 DIAGNOSIS — O10913 Unspecified pre-existing hypertension complicating pregnancy, third trimester: Secondary | ICD-10-CM

## 2021-09-21 DIAGNOSIS — Z880 Allergy status to penicillin: Secondary | ICD-10-CM | POA: Diagnosis not present

## 2021-09-21 LAB — CBC
HCT: 38 % (ref 36.0–46.0)
Hemoglobin: 12.8 g/dL (ref 12.0–15.0)
MCH: 29.2 pg (ref 26.0–34.0)
MCHC: 33.7 g/dL (ref 30.0–36.0)
MCV: 86.6 fL (ref 80.0–100.0)
Platelets: 340 10*3/uL (ref 150–400)
RBC: 4.39 MIL/uL (ref 3.87–5.11)
RDW: 16.1 % — ABNORMAL HIGH (ref 11.5–15.5)
WBC: 10.3 10*3/uL (ref 4.0–10.5)
nRBC: 0 % (ref 0.0–0.2)

## 2021-09-21 LAB — COMPREHENSIVE METABOLIC PANEL
ALT: 15 U/L (ref 0–44)
AST: 16 U/L (ref 15–41)
Albumin: 2.5 g/dL — ABNORMAL LOW (ref 3.5–5.0)
Alkaline Phosphatase: 129 U/L — ABNORMAL HIGH (ref 38–126)
Anion gap: 10 (ref 5–15)
BUN: 6 mg/dL (ref 6–20)
CO2: 22 mmol/L (ref 22–32)
Calcium: 8.8 mg/dL — ABNORMAL LOW (ref 8.9–10.3)
Chloride: 104 mmol/L (ref 98–111)
Creatinine, Ser: 0.81 mg/dL (ref 0.44–1.00)
GFR, Estimated: >60 mL/min (ref >60)
Glucose, Bld: 99 mg/dL (ref 70–99)
Potassium: 3.9 mmol/L (ref 3.5–5.1)
Sodium: 136 mmol/L (ref 135–145)
Total Bilirubin: 0.5 mg/dL (ref 0.3–1.2)
Total Protein: 6.3 g/dL — ABNORMAL LOW (ref 6.5–8.1)

## 2021-09-21 LAB — TYPE AND SCREEN
ABO/RH(D): A POS
Antibody Screen: NEGATIVE

## 2021-09-21 LAB — PROTEIN / CREATININE RATIO, URINE
Creatinine, Urine: 366 mg/dL
Protein Creatinine Ratio: 0.17 mg/mg{Cre} — ABNORMAL HIGH (ref 0.00–0.15)
Total Protein, Urine: 61 mg/dL

## 2021-09-21 LAB — RPR: RPR Ser Ql: NONREACTIVE

## 2021-09-21 MED ORDER — OXYCODONE-ACETAMINOPHEN 5-325 MG PO TABS: 2.0000 | ORAL_TABLET | ORAL | Status: DC | PRN

## 2021-09-21 MED ORDER — OXYTOCIN-SODIUM CHLORIDE 30-0.9 UT/500ML-% IV SOLN: 2.5000 [IU]/h | INTRAVENOUS | Status: DC

## 2021-09-21 MED ORDER — DIBUCAINE (PERIANAL) 1 % EX OINT: 1.0000 | TOPICAL_OINTMENT | CUTANEOUS | Status: DC | PRN

## 2021-09-21 MED ORDER — OXYCODONE HCL 5 MG PO TABS: 5.0000 mg | ORAL_TABLET | ORAL | Status: DC | PRN

## 2021-09-21 MED ORDER — OXYTOCIN-SODIUM CHLORIDE 30-0.9 UT/500ML-% IV SOLN
1.0000 m[IU]/min | INTRAVENOUS | Status: DC
  Administered 2021-09-21: 2 m[IU]/min via INTRAVENOUS
  Filled 2021-09-21: qty 500

## 2021-09-21 MED ORDER — ONDANSETRON HCL 4 MG/2ML IJ SOLN: 4.0000 mg | INTRAMUSCULAR | Status: DC | PRN

## 2021-09-21 MED ORDER — LACTATED RINGERS IV SOLN: 500.0000 mL | INTRAVENOUS | Status: DC | PRN

## 2021-09-21 MED ORDER — FENTANYL CITRATE (PF) 100 MCG/2ML IJ SOLN: 100.0000 ug | Freq: Once | INTRAMUSCULAR | Status: AC

## 2021-09-21 MED ORDER — ACETAMINOPHEN 325 MG PO TABS: 650.0000 mg | ORAL_TABLET | ORAL | Status: DC | PRN

## 2021-09-21 MED ORDER — COCONUT OIL OIL: 1.0000 | TOPICAL_OIL | Status: DC | PRN

## 2021-09-21 MED ORDER — NIFEDIPINE ER OSMOTIC RELEASE 30 MG PO TB24
60.0000 mg | ORAL_TABLET | Freq: Every day | ORAL | Status: DC
  Administered 2021-09-21: 60 mg via ORAL
  Filled 2021-09-21: qty 2

## 2021-09-21 MED ORDER — SOD CITRATE-CITRIC ACID 500-334 MG/5ML PO SOLN: 30.0000 mL | ORAL | Status: DC | PRN

## 2021-09-21 MED ORDER — FENTANYL CITRATE (PF) 100 MCG/2ML IJ SOLN
INTRAMUSCULAR | Status: AC
  Administered 2021-09-21: 100 ug via INTRAVENOUS
  Filled 2021-09-21: qty 2

## 2021-09-21 MED ORDER — ZOLPIDEM TARTRATE 5 MG PO TABS: 5.0000 mg | ORAL_TABLET | Freq: Every evening | ORAL | Status: DC | PRN

## 2021-09-21 MED ORDER — ONDANSETRON HCL 4 MG PO TABS: 4.0000 mg | ORAL_TABLET | ORAL | Status: DC | PRN

## 2021-09-21 MED ORDER — PRENATAL MULTIVITAMIN CH
1.0000 | ORAL_TABLET | Freq: Every day | ORAL | Status: DC
  Administered 2021-09-22: 1 via ORAL
  Filled 2021-09-21: qty 1

## 2021-09-21 MED ORDER — DIPHENHYDRAMINE HCL 25 MG PO CAPS: 25.0000 mg | ORAL_CAPSULE | Freq: Four times a day (QID) | ORAL | Status: DC | PRN

## 2021-09-21 MED ORDER — OXYCODONE-ACETAMINOPHEN 5-325 MG PO TABS: 1.0000 | ORAL_TABLET | ORAL | Status: DC | PRN

## 2021-09-21 MED ORDER — IBUPROFEN 600 MG PO TABS
600.0000 mg | ORAL_TABLET | Freq: Four times a day (QID) | ORAL | Status: DC
  Administered 2021-09-21 – 2021-09-22 (×3): 600 mg via ORAL
  Filled 2021-09-21 (×6): qty 1

## 2021-09-21 MED ORDER — ONDANSETRON HCL 4 MG/2ML IJ SOLN: 4.0000 mg | Freq: Four times a day (QID) | INTRAMUSCULAR | Status: DC | PRN

## 2021-09-21 MED ORDER — LACTATED RINGERS IV SOLN: INTRAVENOUS | Status: DC

## 2021-09-21 MED ORDER — FLEET ENEMA 7-19 GM/118ML RE ENEM: 1.0000 | ENEMA | RECTAL | Status: DC | PRN

## 2021-09-21 MED ORDER — TETANUS-DIPHTH-ACELL PERTUSSIS 5-2.5-18.5 LF-MCG/0.5 IM SUSY: 0.5000 mL | PREFILLED_SYRINGE | Freq: Once | INTRAMUSCULAR | Status: DC

## 2021-09-21 MED ORDER — LIDOCAINE HCL (PF) 1 % IJ SOLN: 30.0000 mL | INTRAMUSCULAR | Status: DC | PRN

## 2021-09-21 MED ORDER — TERBUTALINE SULFATE 1 MG/ML IJ SOLN: 0.2500 mg | Freq: Once | INTRAMUSCULAR | Status: DC | PRN

## 2021-09-21 MED ORDER — WITCH HAZEL-GLYCERIN EX PADS: 1.0000 | MEDICATED_PAD | CUTANEOUS | Status: DC | PRN

## 2021-09-21 MED ORDER — SIMETHICONE 80 MG PO CHEW: 80.0000 mg | CHEWABLE_TABLET | ORAL | Status: DC | PRN

## 2021-09-21 MED ORDER — OXYCODONE HCL 5 MG PO TABS: 10.0000 mg | ORAL_TABLET | ORAL | Status: DC | PRN

## 2021-09-21 MED ORDER — FUROSEMIDE 20 MG PO TABS
20.0000 mg | ORAL_TABLET | Freq: Two times a day (BID) | ORAL | Status: DC
  Administered 2021-09-21 – 2021-09-23 (×4): 20 mg via ORAL
  Filled 2021-09-21 (×4): qty 1

## 2021-09-21 MED ORDER — OXYTOCIN BOLUS FROM INFUSION
333.0000 mL | Freq: Once | INTRAVENOUS | Status: AC
  Administered 2021-09-21: 333 mL via INTRAVENOUS

## 2021-09-21 MED ORDER — BENZOCAINE-MENTHOL 20-0.5 % EX AERO: 1.0000 | INHALATION_SPRAY | CUTANEOUS | Status: DC | PRN

## 2021-09-21 MED ORDER — SENNOSIDES-DOCUSATE SODIUM 8.6-50 MG PO TABS
2.0000 | ORAL_TABLET | Freq: Every day | ORAL | Status: DC
  Administered 2021-09-22: 2 via ORAL
  Filled 2021-09-21: qty 2

## 2021-09-21 NOTE — Discharge Summary (Signed)
Postpartum Discharge Summary  Date of Service updated 09/23/21    Patient Name: Kim Ali DOB: 03/31/1990 MRN: [id number]  Date of admission: 09/21/2021 Delivery date:09/21/2021  Delivering provider: Gaylan Gerold R  Date of discharge: 09/23/2021  Admitting diagnosis: Chronic hypertension in pregnancy [O10.919] Intrauterine pregnancy: [redacted]w[redacted]d    Secondary diagnosis:  Principal Problem:   Chronic hypertension in pregnancy Active Problems:   Morbid obesity with BMI of 60.0-69.9, adult ([facility]  Additional problems: None    Discharge diagnosis: Term Pregnancy Delivered and CHTN                                              Post partum procedures: None Augmentation: AROM and Pitocin Complications: None  Hospital course: Induction of Labor With Vaginal Delivery   31y.o. yo G2P2002 at 371w1das admitted to the hospital 09/21/2021 for induction of labor.  Indication for induction:  chronic hypertension .  Patient had an uncomplicated labor course as follows: Membrane Rupture Time/Date: 11:26 AM ,09/21/2021   Delivery Method:Vaginal, Spontaneous  Episiotomy: None  Lacerations:  None  Details of delivery can be found in separate delivery note.  Patient had a routine postpartum course. Patient is discharged home 09/23/21.  Newborn Data: Birth date:09/21/2021  Birth time:3:53 PM  Gender:Female  Living status:Living  Apgars:9 ,9  Weight:2800 g   Magnesium Sulfate received: No BMZ received: No Rhophylac:N/A MMR:N/A T-DaP:Given prenatally Flu: N/A Transfusion:No  Physical exam  Vitals:   09/22/21 0549 09/22/21 1437 09/22/21 2200 09/23/21 0700  BP:  120/85 132/84 122/88  Pulse: 97 90 (!) 101 88  Resp:   18 18  Temp: 98.2 F (36.8 C) 97.9 F (36.6 C) 98.4 F (36.9 C) 98.5 F (36.9 C)  TempSrc: Oral Oral Oral Oral  SpO2: 99%  94% 98%  Weight:      Height:       General: alert, cooperative, and no distress Lochia: appropriate Uterine Fundus: firm Incision: N/A DVT  Evaluation: Calf/Ankle edema is present Labs: Lab Results  Component Value Date   WBC 13.5 (H) 09/22/2021   HGB 11.6 (L) 09/22/2021   HCT 35.1 (L) 09/22/2021   MCV 88.4 09/22/2021   PLT 323 09/22/2021      Latest Ref Rng & Units 09/21/2021    7:58 AM  CMP  Glucose 70 - 99 mg/dL 99   BUN 6 - 20 mg/dL 6   Creatinine 0.44 - 1.00 mg/dL 0.81   Sodium 135 - 145 mmol/L 136   Potassium 3.5 - 5.1 mmol/L 3.9   Chloride 98 - 111 mmol/L 104   CO2 22 - 32 mmol/L 22   Calcium 8.9 - 10.3 mg/dL 8.8   Total Protein 6.5 - 8.1 g/dL 6.3   Total Bilirubin 0.3 - 1.2 mg/dL 0.5   Alkaline Phos 38 - 126 U/L 129   AST 15 - 41 U/L 16   ALT 0 - 44 U/L 15    Edinburgh Score:    09/22/2021    8:02 AM  Edinburgh Postnatal Depression Scale Screening Tool  I have been able to laugh and see the funny side of things. 0  I have looked forward with enjoyment to things. 0  I have blamed myself unnecessarily when things went wrong. 2  I have been anxious or worried for no good reason. 2  I have felt  scared or panicky for no good reason. 1  Things have been getting on top of me. 1  I have been so unhappy that I have had difficulty sleeping. 0  I have felt sad or miserable. 0  I have been so unhappy that I have been crying. 0  The thought of harming myself has occurred to me. 0  Edinburgh Postnatal Depression Scale Total 6     After visit meds:  Allergies as of 09/23/2021       Reactions   Penicillin G Other (See Comments)   Unknown per Pt        Medication List     STOP taking these medications    triamcinolone ointment 0.1 % Commonly known as: KENALOG       TAKE these medications    acetaminophen 500 MG tablet Commonly known as: TYLENOL Take 1,000 mg by mouth daily as needed for headache. What changed: Another medication with the same name was added. Make sure you understand how and when to take each.   acetaminophen 325 MG tablet Commonly known as: Tylenol Take 2 tablets (650 mg  total) by mouth every 4 (four) hours as needed for up to 10 days (for pain scale < 4). What changed: You were already taking a medication with the same name, and this prescription was added. Make sure you understand how and when to take each.   aspirin EC 81 MG tablet Take 1 tablet (81 mg total) by mouth daily. Take after 12 weeks for prevention of preeclampssia later in pregnancy What changed: how much to take   ibuprofen 600 MG tablet Commonly known as: ADVIL Take 1 tablet (600 mg total) by mouth every 6 (six) hours.   NIFEdipine 90 MG 24 hr tablet Commonly known as: PROCARDIA XL/NIFEDICAL-XL Take 1 tablet (90 mg total) by mouth daily. What changed:  medication strength how much to take   PRENATAL PO Take 2 tablets by mouth daily.       Discharge home in stable condition Infant Feeding: Bottle and Breast Infant Disposition:home with mother Discharge instruction: per After Visit Summary and Postpartum booklet. Activity: Advance as tolerated. Pelvic rest for 6 weeks.  Diet: routine diet Future Appointments: Future Appointments  Date Time Provider [place]  12/28/2021  9:00 AM Kate Sable, MD [id number]   Follow up Visit: Message sent to [place] on 09/21/21 by Gaylan Gerold, CNM  Please schedule this patient for a In person postpartum visit in 4 weeks with the following provider: Any provider. Additional Postpartum F/U:BP check 1 week  High risk pregnancy complicated by: HTN Delivery mode:  Vaginal, Spontaneous  Anticipated Birth Control:  Unsure   09/23/2021 Shelda Pal, DO

## 2021-09-21 NOTE — H&P (Signed)
OBSTETRIC ADMISSION HISTORY AND PHYSICAL  Kim Ali is a 31 y.o. female G2P1001 with IUP at [redacted]w[redacted]d by LMP presenting for IOL for HTN (procardia 60). She reports +FMs, No LOF, no VB, no blurry vision, headaches or peripheral edema, and RUQ pain.  She plans on breast and bottle feeding. She is undecided for birth control. She received her prenatal care at  [facility]    Dating: By LMP --->  Estimated Date of Delivery: 10/04/21  Sono:    @[redacted]w[redacted]d , CWD, normal anatomy, vertex presentation,  2477g, 22% EFW   Prenatal History/Complications: chronic HTN (procardia 60 mg)  Past Medical History: Past Medical History:  Diagnosis Date   Hypertension    Morbid obesity with BMI of 60.0-69.9, adult (HCC)    Trichomoniasis 01/2017    Past Surgical History: Past Surgical History:  Procedure Laterality Date   NO PAST SURGERIES      Obstetrical History: OB History     Gravida  2   Para  1   Term  1   Preterm      AB      Living  1      SAB      IAB      Ectopic      Multiple  0   Live Births  1           Social History Social History   Socioeconomic History   Marital status: Single    Spouse name: 02/2017   Number of children: Not on file   Years of education: Not on file   Highest education level: Not on file  Occupational History   Occupation: [employer]    Comment: [employer]  Tobacco Use   Smoking status: Never   Smokeless tobacco: Never  Vaping Use   Vaping Use: Never used  Substance and Sexual Activity   Alcohol use: Not Currently   Drug use: No   Sexual activity: Yes    Partners: Male  Other Topics Concern   Not on file  Social History Narrative   Not on file   Social Determinants of Health   Financial Resource Strain: Not on file  Food Insecurity: Not on file  Transportation Needs: Not on file  Physical Activity: Not on file  Stress: Not on file  Social Connections: Not on file    Family History: Family History   Problem Relation Age of Onset   Hypertension Mother    Hypertension Father    Hypertension Maternal Grandmother    Aneurysm Paternal Grandmother    Diabetes Paternal Grandfather    Brain cancer Paternal Grandfather    Prostate cancer Paternal Grandfather    Diabetes Maternal Uncle    Diabetes Paternal Uncle    Breast cancer Maternal Great-grandmother    Ovarian cancer Neg Hx    Colon cancer Neg Hx    Heart disease Neg Hx     Allergies: Allergies  Allergen Reactions   Penicillin G Other (See Comments)    Does not know reaction    Pt denies allergies to latex, iodine, or shellfish.  Medications Prior to Admission  Medication Sig Dispense Refill Last Dose   aspirin EC 81 MG tablet Take 1 tablet (81 mg total) by mouth daily. Take after 12 weeks for prevention of preeclampssia later in pregnancy 300 tablet 2 09/20/2021   NIFEdipine (PROCARDIA XL/NIFEDICAL XL) 60 MG 24 hr tablet Take 1 tablet (60 mg total) by mouth daily. 30 tablet 1 09/20/2021  Prenatal Vit-Fe Fumarate-FA (PRENATAL PO) Take 1 tablet by mouth daily.   09/20/2021   triamcinolone ointment (KENALOG) 0.1 % Apply 1 application  topically as needed.        Review of Systems   All systems reviewed and negative except as stated in HPI  Height 5\' 6"  (1.676 m), weight (!) 188.1 kg, last menstrual period 12/28/2020. General appearance: alert and cooperative Lungs: clear to auscultation bilaterally Heart: regular rate and rhythm Abdomen: soft, non-tender; bowel sounds normal Extremities: Homans sign is negative, no sign of DVT Presentation: cephalic Fetal monitoringBaseline: 135 bpm, Variability: Good {> 6 bpm), Accelerations: Reactive, and Decelerations: Absent Uterine activityNone     Prenatal labs: ABO, Rh: A/Positive/-- (01/16 1554) Antibody: Negative (01/16 1554) Rubella: 3.43 (01/16 1554) RPR: Non Reactive (06/01 0942)  HBsAg: Negative (01/16 1554)  HIV: Non Reactive (01/16 1554)  GBS: Negative/-- (07/18  1100)  1 hr Glucola: normal  Genetic screening:  normal  Anatomy 04-29-1988: normal   Prenatal Transfer Tool  Maternal Diabetes: No Genetic Screening: Normal Maternal Ultrasounds/Referrals: Normal Fetal Ultrasounds or other Referrals:  None Maternal Substance Abuse:  No Significant Maternal Medications:  None Significant Maternal Lab Results: Group B Strep negative  No results found for this or any previous visit (from the past 24 hour(s)).  Patient Active Problem List   Diagnosis Date Noted   Chronic hypertension in pregnancy 09/21/2021   Maternal morbid obesity, antepartum (HCC) 09/04/2021   Pre-existing hypertension affecting pregnancy in third trimester 07/04/2021   Vitamin D deficiency 09/27/2015   Morbid obesity with BMI of 60.0-69.9, adult (HCC) 07/23/2015   Supervision of high risk pregnancy, antepartum 07/23/2015    Assessment/Plan:  Kim Ali is a 31 y.o. G2P1001 at [redacted]w[redacted]d here for IOL for chronic HTN (procardia 60)  #Labor: Start pitocin.  #Pain: IV pain medicine, unsure about epidural  #FWB: Cat 1 #ID:  GBS neg #MOF: breast and bottle #MOC: IUD - if she gets an epidural then she would like IUD post-placental  #Circ:  yes  [redacted]w[redacted]d, DO  Center for [place] [facility], [facility] 09/21/2021, 7:43 AM

## 2021-09-22 LAB — CBC
HCT: 35.1 % — ABNORMAL LOW (ref 36.0–46.0)
Hemoglobin: 11.6 g/dL — ABNORMAL LOW (ref 12.0–15.0)
MCH: 29.2 pg (ref 26.0–34.0)
MCHC: 33 g/dL (ref 30.0–36.0)
MCV: 88.4 fL (ref 80.0–100.0)
Platelets: 323 10*3/uL (ref 150–400)
RBC: 3.97 MIL/uL (ref 3.87–5.11)
RDW: 16.4 % — ABNORMAL HIGH (ref 11.5–15.5)
WBC: 13.5 10*3/uL — ABNORMAL HIGH (ref 4.0–10.5)
nRBC: 0 % (ref 0.0–0.2)

## 2021-09-22 MED ORDER — LIDOCAINE 1% INJECTION FOR CIRCUMCISION: 0.8000 mL | INJECTION | Freq: Once | INTRAVENOUS | Status: DC

## 2021-09-22 MED ORDER — EPINEPHRINE TOPICAL FOR CIRCUMCISION 0.1 MG/ML: 1.0000 [drp] | TOPICAL | Status: DC | PRN

## 2021-09-22 MED ORDER — SUCROSE 24% NICU/PEDS ORAL SOLUTION: 0.5000 mL | OROMUCOSAL | Status: DC | PRN

## 2021-09-22 MED ORDER — NIFEDIPINE ER OSMOTIC RELEASE 30 MG PO TB24
90.0000 mg | ORAL_TABLET | Freq: Every day | ORAL | Status: DC
Start: 2021-09-22 — End: 2021-09-23
  Administered 2021-09-22 – 2021-09-23 (×2): 90 mg via ORAL
  Filled 2021-09-22 (×2): qty 3

## 2021-09-22 MED ORDER — WHITE PETROLATUM EX OINT: 1.0000 | TOPICAL_OINTMENT | CUTANEOUS | Status: DC | PRN

## 2021-09-22 MED ORDER — GELATIN ABSORBABLE 12-7 MM EX MISC: 1.0000 | Freq: Once | CUTANEOUS | Status: DC

## 2021-09-22 NOTE — Progress Notes (Signed)
Circumcision Consent   -Circumcision procedure details discussed including usage of local anesthesia, infant soother, and removal and disposal of foreskin. -Risks and benefits of procedure were reviewed including, but not limited to:  *Benefits include reduction in the rates of urinary tract infection (UTI), penile cancer, some sexually transmitted infections, penile inflammatory, and retractile disorders, as well as easier hygiene.   *Risks include bleeding, infection, injury of glans which may lead to need for additional surgery, penile deformity, or urinary tract issues, unsatisfactory cosmetic appearance and other potential complications related to the procedure.   -Informed that procedure will not be performed if provider deems inappropriate d/t penile size, noted deformity, or unsatisfactory pediatric evaluation. -It was emphasized that this is an elective procedure.   -Post circumcision care discussed.   Patient wants to proceed with circumcision. Informed that team would be notified and complete prior to discharge and upon satisfactory pediatric evaluation of infant.    Myrtie Hawk, DO FMOB Fellow, Faculty practice [facility], Center for [facility] 09/22/21 8:33 AM

## 2021-09-22 NOTE — Lactation Note (Signed)
This note was copied from a baby's chart. Lactation Consultation Note  Patient Name: Kim Ali OOILN'Z Date: 09/22/2021   Age:30 hours Per RN, mom declined LC services tonight would like be seen in morning. Maternal Data    Feeding Nipple Type: Slow - flow  LATCH Score                    Lactation Tools Discussed/Used    Interventions    Discharge    Consult Status      Kim Ali 09/22/2021, 12:32 AM

## 2021-09-22 NOTE — Lactation Note (Signed)
This note was copied from a baby's chart. Lactation Consultation Note  Patient Name: Kim Ali WKGSU'P Date: 09/22/2021 Reason for consult: Initial assessment;Early term 37-38.6wks;Breastfeeding assistance Age:30 hours  P2, Early Term, Infant Female  LC entered the room and baby was asleep in the bassinet. Per the birthing parent, baby has been asleep since his bath and should be ready to feed.   LC attempted to assist the birthing parent with latching baby to the left breast in the cross cradle position. Baby was too sleepy and not interested in latching. Per the birthing parent, baby had about 23mL of formula at 6pm.   LC demonstrated hand expression and spoke with the birthing parent about infant behavior.   LC reviewed the [facility] services brochure with the birthing parent and encouraged her to call for assistance when baby is showing feeding cues.   LC left her name on the board.  Current Feeding Plan:  Breastfeed according to feeding cues 8+ times in 24 hours.  Put baby to the breast prior to supplementing with formula.  Supplement baby with formula according to supplementation guidelines.  Call RN/LC for assistance with breastfeeding.   Maternal Data Has patient been taught Hand Expression?: Yes Does the patient have breastfeeding experience prior to this delivery?: Yes How long did the patient breastfeed?: 3 weeks  Feeding Mother's Current Feeding Choice: Breast Milk and Formula  LATCH Score                    Lactation Tools Discussed/Used    Interventions Interventions: Breast feeding basics reviewed;Assisted with latch;Hand express;Breast massage;Education;LC Services brochure  Discharge Pump: Personal;Hands Free  Consult Status Consult Status: Follow-up Date: 09/23/21 Follow-up type: In-patient    Kim Ali 09/22/2021, 8:05 AM

## 2021-09-22 NOTE — Progress Notes (Signed)
POSTPARTUM PROGRESS NOTE  Post Partum Day 1  Subjective:  Kim Ali is a 31 y.o. [id number] s/p SVD at [redacted]w[redacted]d.  She reports she is doing well. No acute events overnight. She denies any problems with ambulating, voiding or po intake. Denies nausea or vomiting.  Pain is well controlled.  Lochia is adequate.  Objective: Blood pressure (!) 150/85, pulse 97, temperature 98.2 F (36.8 C), temperature source Oral, resp. rate 18, height 5\' 6"  (1.676 m), weight (!) 188.1 kg, last menstrual period 12/28/2020, SpO2 99 %, unknown if currently breastfeeding.  Physical Exam:  General: alert, cooperative and no distress Chest: no respiratory distress Heart:regular rate, distal pulses intact Uterine Fundus: firm, appropriately tender DVT Evaluation: No calf swelling or tenderness Extremities: + edema Skin: warm, dry  Recent Labs    09/21/21 0758 09/22/21 0434  HGB 12.8 11.6*  HCT 38.0 35.1*    Assessment/Plan: Kim Ali is a 31 y.o. 37 s/p SVD at [redacted]w[redacted]d   PPD#1 - Doing well  Routine postpartum care Contraception: Undecided Feeding: Breast and bottle cHTN: BP not well controlled on Procardia 60mg . Increased to 90mg  per day. Cont Lasix   Dispo: Plan for discharge tomorrow.   LOS: 1 day   [redacted]w[redacted]d, DO OB Fellow  09/22/2021, 8:00 AM

## 2021-09-23 MED ORDER — IBUPROFEN 600 MG PO TABS: 600.0000 mg | ORAL_TABLET | Freq: Four times a day (QID) | ORAL | 0 refills | Status: DC

## 2021-09-23 MED ORDER — NIFEDIPINE ER OSMOTIC RELEASE 90 MG PO TB24: 90.0000 mg | ORAL_TABLET | Freq: Every day | ORAL | 0 refills | Status: DC

## 2021-09-23 MED ORDER — ACETAMINOPHEN 325 MG PO TABS: 650.0000 mg | ORAL_TABLET | ORAL | 0 refills | Status: AC | PRN

## 2021-10-01 ENCOUNTER — Ambulatory Visit: Payer: Commercial Managed Care - PPO

## 2021-10-01 VITALS — BP 144/99 | HR 101

## 2021-10-01 DIAGNOSIS — O10919 Unspecified pre-existing hypertension complicating pregnancy, unspecified trimester: Secondary | ICD-10-CM

## 2021-10-01 MED ORDER — LISINOPRIL 5 MG PO TABS: 5.0000 mg | ORAL_TABLET | Freq: Every day | ORAL | 0 refills | Status: DC

## 2021-10-01 NOTE — Progress Notes (Signed)
Patient seen and assessed by nursing staff.  Agree with documentation and plan.  

## 2021-10-01 NOTE — Progress Notes (Signed)
Subjective:  Kim Ali is a 31 y.o. female here for BP check.   Hypertension ROS: Patient denies any headaches, visual symptoms, RUQ/epigastric pain or other concerning symptoms.  Objective:  There were no vitals taken for this visit.  Appearance alert, well appearing, and in no distress. General exam BP noted to be 144/99 P:101 today in office.    Assessment:   Blood Pressure needs improvement.   Plan:  Consulted with provider needs Repeat B/P in 4-5 days. Rx sent as directed pt made aware and voiced understanding.   Marland Kitchen

## 2021-10-08 ENCOUNTER — Ambulatory Visit (INDEPENDENT_AMBULATORY_CARE_PROVIDER_SITE_OTHER): Payer: Commercial Managed Care - PPO | Admitting: *Deleted

## 2021-10-08 VITALS — BP 147/88

## 2021-10-08 DIAGNOSIS — O10919 Unspecified pre-existing hypertension complicating pregnancy, unspecified trimester: Secondary | ICD-10-CM

## 2021-10-08 DIAGNOSIS — O165 Unspecified maternal hypertension, complicating the puerperium: Secondary | ICD-10-CM

## 2021-10-08 DIAGNOSIS — O1093 Unspecified pre-existing hypertension complicating the puerperium: Secondary | ICD-10-CM

## 2021-10-08 MED ORDER — LISINOPRIL 10 MG PO TABS: 10.0000 mg | ORAL_TABLET | Freq: Every day | ORAL | 2 refills | Status: DC

## 2021-10-08 NOTE — Progress Notes (Cosign Needed)
Subjective:  Kim Ali is a 31 y.o. female here for BP check.   Hypertension ROS: Patient denies any headaches, visual symptoms, RUQ/epigastric pain or other concerning symptoms.  Objective:  BP (!) 147/88   Appearance alert, well appearing, and in no distress. General exam BP noted to be un changed today in office.    Assessment:   Blood Pressure needs improvement.   Plan:  To increase lisinopril to 10mg  daily.  Return in one week for BP check and will also refer to OB cardio.  , RN

## 2021-10-16 ENCOUNTER — Ambulatory Visit (INDEPENDENT_AMBULATORY_CARE_PROVIDER_SITE_OTHER): Payer: Commercial Managed Care - PPO

## 2021-10-16 VITALS — BP 136/88 | HR 98

## 2021-10-16 DIAGNOSIS — O10919 Unspecified pre-existing hypertension complicating pregnancy, unspecified trimester: Secondary | ICD-10-CM

## 2021-10-16 DIAGNOSIS — O1093 Unspecified pre-existing hypertension complicating the puerperium: Secondary | ICD-10-CM

## 2021-10-16 NOTE — Progress Notes (Signed)
Subjective:  Kim Ali is a 31 y.o. female here for BP check.   Hypertension ROS: Patient denies any headaches, visual symptoms, RUQ/epigastric pain or other concerning symptoms.  Objective:  There were no vitals taken for this visit.  Appearance alert, well appearing, and in no distress. General exam BP noted to be 136/88 today in office.    Assessment:   Blood Pressure improved.   Plan:  Continue Medication and Keep PP .

## 2021-10-26 ENCOUNTER — Ambulatory Visit (INDEPENDENT_AMBULATORY_CARE_PROVIDER_SITE_OTHER): Payer: Commercial Managed Care - PPO | Admitting: Cardiology

## 2021-10-26 ENCOUNTER — Encounter: Payer: Self-pay | Admitting: Cardiology

## 2021-10-26 VITALS — BP 128/84 | HR 107 | Ht 65.0 in | Wt 394.3 lb

## 2021-10-26 DIAGNOSIS — I10 Essential (primary) hypertension: Secondary | ICD-10-CM

## 2021-10-26 DIAGNOSIS — O165 Unspecified maternal hypertension, complicating the puerperium: Secondary | ICD-10-CM | POA: Diagnosis not present

## 2021-10-26 MED ORDER — HYDROCHLOROTHIAZIDE 12.5 MG PO CAPS: 12.5000 mg | ORAL_CAPSULE | Freq: Every day | ORAL | 3 refills | Status: AC

## 2021-10-26 NOTE — Patient Instructions (Addendum)
Medication Instructions:  Your physician has recommended you make the following change in your medication:  START: Hydrochlorothiazide 12.5 mg once daily Please take your blood pressure daily for 2 weeks and send in a MyChart message. Please include heart rates. If your blood pressure is greater then 140/90 for two days in a row please contact my office.   HOW TO TAKE YOUR BLOOD PRESSURE: Rest 5 minutes before taking your blood pressure. Don't smoke or drink caffeinated beverages for at least 30 minutes before. Take your blood pressure before (not after) you eat. Sit comfortably with your back supported and both feet on the floor (don't cross your legs). Elevate your arm to heart level on a table or a desk. Use the proper sized cuff. It should fit smoothly and snugly around your bare upper arm. There should be enough room to slip a fingertip under the cuff. The bottom edge of the cuff should be 1 inch above the crease of the elbow. Ideally, take 3 measurements at one sitting and record the average.   *If you need a refill on your cardiac medications before your next appointment, please call your pharmacy*   Lab Work: None  Testing/Procedures: Your physician has requested that you have an echocardiogram. Echocardiography is a painless test that uses sound waves to create images of your heart. It provides your doctor with information about the size and shape of your heart and how well your heart's chambers and valves are working. This procedure takes approximately one hour. There are no restrictions for this procedure.   Follow-Up: At [facility], you and your health needs are our priority.  As part of our continuing mission to provide you with exceptional heart care, we have created designated Provider Care Teams.  These Care Teams include your primary Cardiologist (physician) and Advanced Practice Providers (APPs -  Physician Assistants and Nurse Practitioners) who all work  together to provide you with the care you need, when you need it.  We recommend signing up for the patient portal called "MyChart".  Sign up information is provided on this After Visit Summary.  MyChart is used to connect with patients for Virtual Visits (Telemedicine).  Patients are able to view lab/test results, encounter notes, upcoming appointments, etc.  Non-urgent messages can be sent to your provider as well.   To learn more about what you can do with MyChart, go to [email or web address].    Your next appointment:   12 week(s)  The format for your next appointment:   In Person  Provider:   Thomasene Ripple, DO [address], [place], [place] [address]     Other Instructions   Important Information About Sugar

## 2021-10-26 NOTE — Progress Notes (Unsigned)
Cardio-Obstetrics Clinic  Follow Up Note   Date:  10/26/2021   ID:  Kim Ali, DOB 10-Jul-1990, MRN [id number]  PCP:  [facility]   [facility] Providers Cardiologist:  None  Electrophysiologist:  None   { Click to update primary MD,subspecialty MD or APP then REFRESH:1}     Referring MD: [facility]   Chief Complaint: " I have had high blood pressure recently"   History of Present Illness:    Kim Ali is a 31 y.o. female [G2P2002] who returns for follow up of postpartum hypertension.    Prior CV Studies Reviewed: The following studies were reviewed today:   Past Medical History:  Diagnosis Date   Hypertension    Morbid obesity with BMI of 60.0-69.9, adult (HCC)    Trichomoniasis 01/2017    Past Surgical History:  Procedure Laterality Date   NO PAST SURGERIES     { Click here to update PMH, PSH, OB Hx then refresh note  :1}   OB History     Gravida  2   Para  2   Term  2   Preterm      AB      Living  2      SAB      IAB      Ectopic      Multiple  0   Live Births  2           { Click here to update OB Charting then refresh note  :1}    Current Medications: Current Meds  Medication Sig   lisinopril (PRINIVIL) 10 MG tablet Take 1 tablet (10 mg total) by mouth daily.   NIFEdipine (PROCARDIA XL/NIFEDICAL-XL) 90 MG 24 hr tablet Take 1 tablet (90 mg total) by mouth daily.     Allergies:   Penicillin g   Social History   Socioeconomic History   Marital status: Single    Spouse name: English as a second language teacher   Number of children: Not on file   Years of education: Not on file   Highest education level: Not on file  Occupational History   Occupation: MOA    Comment: [place] [employer]  Tobacco Use   Smoking status: Never   Smokeless tobacco: Never  Vaping Use   Vaping Use: Never used  Substance and Sexual Activity   Alcohol use: Not Currently   Drug use: No   Sexual  activity: Yes    Partners: Male  Other Topics Concern   Not on file  Social History Narrative   Not on file   Social Determinants of Health   Financial Resource Strain: Not on file  Food Insecurity: Not on file  Transportation Needs: Not on file  Physical Activity: Not on file  Stress: Not on file  Social Connections: Not on file  { Click here to update SDOH then refresh :1}    Family History  Problem Relation Age of Onset   Hypertension Mother    Hypertension Father    Hypertension Maternal Grandmother    Aneurysm Paternal Grandmother    Diabetes Paternal Grandfather    Brain cancer Paternal Grandfather    Prostate cancer Paternal Grandfather    Diabetes Maternal Uncle    Diabetes Paternal Uncle    Breast cancer Maternal Great-grandmother    Ovarian cancer Neg Hx    Colon cancer Neg Hx    Heart disease Neg Hx    { Click here to update FH then  refresh note    :1}   ROS:   Please see the history of present illness.    *** All other systems reviewed and are negative.   Labs/EKG Reviewed:    EKG:   EKG is *** ordered today.  The ekg ordered today demonstrates ***  Recent Labs: 03/05/2021: TSH 1.800 09/21/2021: ALT 15; BUN 6; Creatinine, Ser 0.81; Potassium 3.9; Sodium 136 09/22/2021: Hemoglobin 11.6; Platelets 323   Recent Lipid Panel No results found for: "CHOL", "TRIG", "HDL", "CHOLHDL", "LDLCALC", "LDLDIRECT"  Physical Exam:    VS:  BP 128/84   Pulse (!) 107   Ht 5\' 5"  (1.651 m)   Wt (!) 394 lb 4.8 oz (178.9 kg)   SpO2 98%   BMI 65.61 kg/m     Wt Readings from Last 3 Encounters:  10/26/21 (!) 394 lb 4.8 oz (178.9 kg)  09/21/21 (!) 414 lb 9.6 oz (188.1 kg)  09/17/21 (!) 414 lb 3.2 oz (187.9 kg)     GEN: *** Well nourished, well developed in no acute distress HEENT: Normal NECK: No JVD; No carotid bruits LYMPHATICS: No lymphadenopathy CARDIAC: ***RRR, no murmurs, rubs, gallops RESPIRATORY:  Clear to auscultation without rales, wheezing or rhonchi   ABDOMEN: Soft, non-tender, non-distended MUSCULOSKELETAL:  No edema; No deformity  SKIN: Warm and dry NEUROLOGIC:  Alert and oriented x 3 PSYCHIATRIC:  Normal affect    Risk Assessment/Risk Calculators:   { Click to calculate CARPREG II - THEN refresh note :1}    { Click to caclulate Mod WHO Class of CV Risk - THEN refresh note :1}     { Click for CHADS2VASc Score - THEN Refresh Note    :09/19/21      ASSESSMENT & PLAN:    Postpartum Hypertension  Morbid obesity   Her blood pressure does not appear to be well controlled at home.  Will stop lisinopril and start HCTZ which may help better control her bp. Will keep her on the nifedipine for now.  She is not breastfeeding.      There are no Patient Instructions on file for this visit.   Dispo:  No follow-ups on file.   Medication Adjustments/Labs and Tests Ordered: Current medicines are reviewed at length with the patient today.  Concerns regarding medicines are outlined above.  Tests Ordered: No orders of the defined types were placed in this encounter.  Medication Changes: No orders of the defined types were placed in this encounter.

## 2021-10-30 ENCOUNTER — Encounter: Payer: Self-pay | Admitting: Obstetrics & Gynecology

## 2021-10-30 ENCOUNTER — Ambulatory Visit (INDEPENDENT_AMBULATORY_CARE_PROVIDER_SITE_OTHER): Payer: Commercial Managed Care - PPO | Admitting: Obstetrics & Gynecology

## 2021-10-30 DIAGNOSIS — I10 Essential (primary) hypertension: Secondary | ICD-10-CM

## 2021-10-30 MED ORDER — NIFEDIPINE ER OSMOTIC RELEASE 90 MG PO TB24: 90.0000 mg | ORAL_TABLET | Freq: Every day | ORAL | 2 refills | Status: AC

## 2021-10-30 NOTE — Progress Notes (Signed)
Post Partum Visit Note  Kim Ali is a 31 y.o. G51P2002 female who presents for a postpartum visit. She is 5 weeks postpartum following a normal spontaneous vaginal delivery.  I have fully reviewed the prenatal and intrapartum course. The delivery was at 38 gestational weeks.  Anesthesia: none. Postpartum course has been unremarkable. Baby is doing well. Baby is feeding by bottle Daron Offer . Bleeding staining only. Bowel function is normal. Bladder function is normal. Patient is sexually active. Contraception method considering IUD . Postpartum depression screening: negative.   The pregnancy intention screening data noted above was reviewed. Potential methods of contraception were discussed. The patient elected to proceed with No data recorded.   Edinburgh Postnatal Depression Scale - 10/30/21 1503       Edinburgh Postnatal Depression Scale:  In the Past 7 Days   I have been able to laugh and see the funny side of things. 0    I have looked forward with enjoyment to things. 0    I have blamed myself unnecessarily when things went wrong. 0    I have been anxious or worried for no good reason. 0    I have felt scared or panicky for no good reason. 0    Things have been getting on top of me. 0    I have been so unhappy that I have had difficulty sleeping. 0    I have felt sad or miserable. 0    I have been so unhappy that I have been crying. 0    The thought of harming myself has occurred to me. 0    Edinburgh Postnatal Depression Scale Total 0             Health Maintenance Due  Topic Date Due   COVID-19 Vaccine (1) Never done   INFLUENZA VACCINE  09/18/2021    The following portions of the patient's history were reviewed and updated as appropriate: allergies, current medications, past family history, past medical history, past social history, past surgical history, and problem list.  Review of Systems Pertinent items noted in HPI and remainder of  comprehensive ROS otherwise negative.  Objective:  BP 137/88   Pulse 93   Wt (!) 397 lb (180.1 kg)   Breastfeeding No   BMI 66.06 kg/m    General:  alert and no distress   Breasts:  not indicated  Lungs: clear to auscultation bilaterally  Heart:  regular rate and rhythm  Abdomen: soft, non-tender; bowel sounds normal; no masses,  no organomegaly        Assessment:   Normal postpartum exam.   Plan:   Essential components of care per ACOG recommendations:  1.  Mood and well being: Patient with negative depression screening today. Reviewed local resources for support.  - Patient tobacco use? No.   - hx of drug use? No.    2. Infant care and feeding:  -Patient currently breastmilk feeding? No.  -Social determinants of health (SDOH) reviewed in EPIC. No concerns.  3. Sexuality, contraception and birth spacing - Patient does not want a pregnancy in the next year.  Desired family size is 3 children.  - Reviewed reproductive life planning. Reviewed contraceptive methods based on pt preferences and effectiveness.  Patient considering Mirena, will return for placement. Advised no unprotected intercourse for two week prior to placement. - Discussed birth spacing of 18 months  4. Sleep and fatigue -Encouraged family/partner/community support of 4 hrs of uninterrupted sleep to help  with mood and fatigue  5. Physical Recovery  - Discussed patients delivery and complications. She describes her labor as good. - Patient had a Vaginal, no problems at delivery. Patient had a 1st degree laceration. Perineal healing reviewed. Patient expressed understanding - Patient has urinary incontinence? No. - Patient is safe to resume physical and sexual activity  6.  Health Maintenance - HM due items addressed Yes - Last pap smear  Diagnosis  Date Value Ref Range Status  03/22/2021      - Negative for intraepithelial lesion or malignancy (NILM)   Pap smear not done at today's visit.  -Breast  Cancer screening indicated? No.   7. Chronic Disease/Pregnancy Condition follow up: Hypertension - Cardiologist managing, on HCTZ and Procardia.   Jaynie Collins, MD Center for [employer], [facility]

## 2021-11-09 ENCOUNTER — Other Ambulatory Visit (HOSPITAL_COMMUNITY): Payer: Commercial Managed Care - PPO

## 2021-11-27 ENCOUNTER — Other Ambulatory Visit (HOSPITAL_COMMUNITY): Payer: Commercial Managed Care - PPO

## 2021-12-04 ENCOUNTER — Ambulatory Visit: Payer: Commercial Managed Care - PPO | Admitting: Obstetrics & Gynecology

## 2021-12-13 ENCOUNTER — Encounter (HOSPITAL_COMMUNITY): Payer: Self-pay | Admitting: Cardiology

## 2021-12-13 ENCOUNTER — Other Ambulatory Visit (HOSPITAL_COMMUNITY): Payer: Commercial Managed Care - PPO

## 2021-12-28 ENCOUNTER — Ambulatory Visit: Payer: Commercial Managed Care - PPO | Attending: Cardiology | Admitting: Cardiology

## 2021-12-28 ENCOUNTER — Encounter: Payer: Self-pay | Admitting: Cardiology

## 2021-12-28 VITALS — BP 124/80 | HR 107 | Ht 66.0 in | Wt >= 6400 oz

## 2021-12-28 DIAGNOSIS — I10 Essential (primary) hypertension: Secondary | ICD-10-CM

## 2021-12-28 NOTE — Progress Notes (Signed)
Cardiology Office Note:    Date:  12/28/2021   ID:  Kim Ali, DOB Mar 08, 1990, MRN [id number]  PCP:  [facility]   [facility] Providers Cardiologist:  None     Referring MD: [facility]   Chief Complaint  Patient presents with   Follow-up    5 month f/u, HTN, occasional pressure checks normal     History of Present Illness:    Kim Ali is a 31 y.o. female with a hx of hypertension, morbid obesity, who presents for follow-up.  Previously seen due to hypertension in the context of pregnancy.  Nifedipine previously increased to 90 mg daily.  Tolerating medications, no adverse effects.  Had a successful delivery.  Son is currently 46 months old.  Evaluated at cardio obstetrics clinic for postpartum hypertension.  HCTZ added to her regimen.  Blood pressures have been adequately controlled on current regimen of nifedipine 90 mg, HCTZ 12.5 mg daily.  Echo previously ordered, not obtained as patient developed COVID infection.  States feeling well, no new concerns at this time.     Past Medical History:  Diagnosis Date   Hypertension    Morbid obesity with BMI of 60.0-69.9, adult (HCC)    Trichomoniasis 01/2017    Past Surgical History:  Procedure Laterality Date   NO PAST SURGERIES      Current Medications: Current Meds  Medication Sig   hydrochlorothiazide (MICROZIDE) 12.5 MG capsule Take 1 capsule (12.5 mg total) by mouth daily.   NIFEdipine (PROCARDIA XL/NIFEDICAL-XL) 90 MG 24 hr tablet Take 1 tablet (90 mg total) by mouth daily.     Allergies:   Penicillin g   Social History   Socioeconomic History   Marital status: Single    Spouse name: English as a second language teacher   Number of children: Not on file   Years of education: Not on file   Highest education level: Not on file  Occupational History   Occupation: MOA    Comment: [employer]  Tobacco Use   Smoking status: Never   Smokeless tobacco: Never  Vaping Use    Vaping Use: Never used  Substance and Sexual Activity   Alcohol use: Not Currently    Comment: occasionally   Drug use: No   Sexual activity: Yes    Partners: Male  Other Topics Concern   Not on file  Social History Narrative   Not on file   Social Determinants of Health   Financial Resource Strain: Not on file  Food Insecurity: Not on file  Transportation Needs: Not on file  Physical Activity: Not on file  Stress: Not on file  Social Connections: Not on file     Family History: The patient's family history includes Aneurysm in her paternal grandmother; Brain cancer in her paternal grandfather; Breast cancer in her maternal great-grandmother; Diabetes in her maternal uncle, paternal grandfather, and paternal uncle; Hypertension in her father, maternal grandmother, and mother; Prostate cancer in her paternal grandfather. There is no history of Ovarian cancer, Colon cancer, or Heart disease.  ROS:   Please see the history of present illness.     All other systems reviewed and are negative.  EKGs/Labs/Other Studies Reviewed:    The following studies were reviewed today:   EKG:  EKG is  ordered today.  The ekg ordered today demonstrates sinus tachycardia, 107  Recent Labs: 03/05/2021: TSH 1.800 09/21/2021: ALT 15; BUN 6; Creatinine, Ser 0.81; Potassium 3.9; Sodium 136 09/22/2021: Hemoglobin 11.6; Platelets 323  Recent Lipid Panel No results found for: "CHOL", "TRIG", "HDL", "CHOLHDL", "VLDL", "LDLCALC", "LDLDIRECT"   Risk Assessment/Calculations:         Physical Exam:    VS:  BP 124/80 (BP Location: Left Arm, Patient Position: Sitting, Cuff Size: Normal) Comment (Cuff Size): upper forearm  Pulse (!) 107   Ht 5\' 6"  (1.676 m)   Wt (!) 400 lb 6.4 oz (181.6 kg)   SpO2 98%   BMI 64.63 kg/m     Wt Readings from Last 3 Encounters:  12/28/21 (!) 400 lb 6.4 oz (181.6 kg)  10/30/21 (!) 397 lb (180.1 kg)  10/26/21 (!) 394 lb 4.8 oz (178.9 kg)     GEN:  Well nourished,  well developed in no acute distress HEENT: Normal NECK: No JVD; No carotid bruits LYMPHATICS: No lymphadenopathy CARDIAC: RRR, no murmurs, rubs, gallops RESPIRATORY:  Clear to auscultation without rales, wheezing or rhonchi  ABDOMEN: Soft, non-tender, non-distended MUSCULOSKELETAL:  No edema; No deformity  SKIN: Warm and dry NEUROLOGIC:  Alert and oriented x 3 PSYCHIATRIC:  Normal affect   ASSESSMENT:    1. Primary hypertension   2. Morbid obesity ([place])    PLAN:    In order of problems listed above:  Hypertension, BP controlled.  Continue nifedipine 90 mg daily, HCTZ 12.5 mg daily. Morbid obesity, low-calorie diet, weight loss recommended. Patient advised to obtain echo which was previously ordered.    Follow-up as needed based on echocardiogram findings.     Medication Adjustments/Labs and Tests Ordered: Current medicines are reviewed at length with the patient today.  Concerns regarding medicines are outlined above.  Orders Placed This Encounter  Procedures   EKG 12-Lead   No orders of the defined types were placed in this encounter.   Patient Instructions  Medication Instructions:   Your physician recommends that you continue on your current medications as directed. Please refer to the Current Medication list given to you today.  *If you need a refill on your cardiac medications before your next appointment, please call your pharmacy*    Testing/Procedures:  Need to get Echo ordered by Berniece Salines scheduled before your follow up with her on 01/21/22   Follow-Up: At [facility], you and your health needs are our priority.  As part of our continuing mission to provide you with exceptional heart care, we have created designated Provider Care Teams.  These Care Teams include your primary Cardiologist (physician) and Advanced Practice Providers (APPs -  Physician Assistants and Nurse Practitioners) who all work together to provide you with the care you need,  when you need it.  We recommend signing up for the patient portal called "MyChart".  Sign up information is provided on this After Visit Summary.  MyChart is used to connect with patients for Virtual Visits (Telemedicine).  Patients are able to view lab/test results, encounter notes, upcoming appointments, etc.  Non-urgent messages can be sent to your provider as well.   To learn more about what you can do with MyChart, go to [email or web address].    Your next appointment:   Follow up as needed   The format for your next appointment:   In Person  Provider:   Kate Sable, MD    Other Instructions   Important Information About Sugar         Signed, Kate Sable, MD  12/28/2021 9:37 AM    [place]

## 2021-12-28 NOTE — Patient Instructions (Signed)
Medication Instructions:   Your physician recommends that you continue on your current medications as directed. Please refer to the Current Medication list given to you today.  *If you need a refill on your cardiac medications before your next appointment, please call your pharmacy*    Testing/Procedures:  Need to get Echo ordered by Thomasene Ripple scheduled before your follow up with her on 01/21/22   Follow-Up: At [facility], you and your health needs are our priority.  As part of our continuing mission to provide you with exceptional heart care, we have created designated Provider Care Teams.  These Care Teams include your primary Cardiologist (physician) and Advanced Practice Providers (APPs -  Physician Assistants and Nurse Practitioners) who all work together to provide you with the care you need, when you need it.  We recommend signing up for the patient portal called "MyChart".  Sign up information is provided on this After Visit Summary.  MyChart is used to connect with patients for Virtual Visits (Telemedicine).  Patients are able to view lab/test results, encounter notes, upcoming appointments, etc.  Non-urgent messages can be sent to your provider as well.   To learn more about what you can do with MyChart, go to [email or web address].    Your next appointment:   Follow up as needed   The format for your next appointment:   In Person  Provider:   Debbe Odea, MD    Other Instructions   Important Information About Sugar

## 2022-01-15 ENCOUNTER — Encounter: Payer: Self-pay | Admitting: Obstetrics & Gynecology

## 2022-01-15 ENCOUNTER — Ambulatory Visit (INDEPENDENT_AMBULATORY_CARE_PROVIDER_SITE_OTHER): Payer: Commercial Managed Care - PPO | Admitting: Obstetrics & Gynecology

## 2022-01-15 VITALS — BP 171/87 | HR 89

## 2022-01-15 DIAGNOSIS — Z30430 Encounter for insertion of intrauterine contraceptive device: Secondary | ICD-10-CM

## 2022-01-15 LAB — POCT URINE PREGNANCY: Preg Test, Ur: NEGATIVE

## 2022-01-15 MED ORDER — LEVONORGESTREL 20 MCG/DAY IU IUD
1.0000 | INTRAUTERINE_SYSTEM | Freq: Once | INTRAUTERINE | Status: AC
  Administered 2022-01-15: 1 via INTRAUTERINE

## 2022-01-15 NOTE — Progress Notes (Signed)
    GYNECOLOGY OFFICE PROCEDURE NOTE  Kim Ali is a 31 y.o. [id number] here for Mirena IUD insertion. No GYN concerns.  Last pap smear was on 03/22/2021 and was normal.  IUD Insertion Procedure Note Patient identified, informed consent performed, consent signed.   Discussed risks of irregular bleeding, cramping, infection, malpositioning or misplacement of the IUD outside the uterus which may require further procedure such as laparoscopy. Also discussed >99% contraception efficacy, increased risk of ectopic pregnancy with failure of method.   Emphasized that this did not protect against STIs, condoms recommended during all sexual encounters. Time out was performed.  Urine pregnancy test negative.  Speculum placed in the vagina.  Cervix visualized.  Cleaned with Betadine x 2.  Grasped anteriorly with a single tooth tenaculum.  Uterus sounded to 9 cm.  Mirena IUD placed per manufacturer's recommendations.  Strings trimmed to 3 cm. Tenaculum was removed, good hemostasis noted.  Patient tolerated procedure well.   Patient was given post-procedure instructions.  She was advised to have backup contraception for one week.  Patient was also asked to check IUD strings periodically and follow up in 4 weeks for IUD check.   Jaynie Collins, MD, FACOG Obstetrician & Gynecologist, [facility] for [employer], [facility]

## 2022-01-15 NOTE — Addendum Note (Signed)
Addended by: Scheryl Marten on: 01/15/2022 09:33 AM   Modules accepted: Orders

## 2022-01-21 ENCOUNTER — Ambulatory Visit: Payer: Commercial Managed Care - PPO | Attending: Cardiology | Admitting: Cardiology

## 2022-01-28 ENCOUNTER — Encounter: Payer: Self-pay | Admitting: Cardiology

## 2022-02-19 ENCOUNTER — Ambulatory Visit: Payer: Commercial Managed Care - PPO | Admitting: Obstetrics & Gynecology

## 2022-03-04 ENCOUNTER — Other Ambulatory Visit (HOSPITAL_COMMUNITY)
Admission: RE | Admit: 2022-03-04 | Discharge: 2022-03-04 | Disposition: A | Discharge status: Home / Self Care | Payer: BC Managed Care – PPO | Source: Ambulatory Visit | Attending: Obstetrics and Gynecology | Admitting: Obstetrics and Gynecology

## 2022-03-04 ENCOUNTER — Encounter: Payer: Self-pay | Admitting: Obstetrics and Gynecology

## 2022-03-04 ENCOUNTER — Ambulatory Visit: Payer: BC Managed Care – PPO | Admitting: Obstetrics and Gynecology

## 2022-03-04 VITALS — BP 158/113 | HR 97

## 2022-03-04 DIAGNOSIS — Z30431 Encounter for routine checking of intrauterine contraceptive device: Secondary | ICD-10-CM

## 2022-03-04 DIAGNOSIS — N921 Excessive and frequent menstruation with irregular cycle: Secondary | ICD-10-CM

## 2022-03-04 DIAGNOSIS — O10913 Unspecified pre-existing hypertension complicating pregnancy, third trimester: Secondary | ICD-10-CM

## 2022-03-04 DIAGNOSIS — Z975 Presence of (intrauterine) contraceptive device: Secondary | ICD-10-CM | POA: Insufficient documentation

## 2022-03-04 NOTE — Progress Notes (Unsigned)
Qday spotting Last period early nov

## 2022-03-04 NOTE — Progress Notes (Unsigned)
Has missed a few doses of her BP meds  Denies any issues with IUD

## 2022-03-06 LAB — CERVICOVAGINAL ANCILLARY ONLY
Bacterial Vaginitis (gardnerella): NEGATIVE
Candida Glabrata: NEGATIVE
Candida Vaginitis: POSITIVE — AB
Chlamydia: NEGATIVE
Comment: NEGATIVE
Comment: NEGATIVE
Comment: NEGATIVE
Comment: NEGATIVE
Comment: NEGATIVE
Comment: NORMAL
Neisseria Gonorrhea: NEGATIVE
Trichomonas: NEGATIVE

## 2022-03-09 MED ORDER — FLUCONAZOLE 150 MG PO TABS: 150.0000 mg | ORAL_TABLET | Freq: Once | ORAL | 0 refills | Status: AC

## 2022-03-09 NOTE — Addendum Note (Signed)
Addended by: Aletha Halim on: 03/09/2022 04:43 AM   Modules accepted: Orders

## 2022-06-16 ENCOUNTER — Other Ambulatory Visit: Payer: Self-pay

## 2022-06-16 ENCOUNTER — Emergency Department: Payer: BC Managed Care – PPO

## 2022-06-16 ENCOUNTER — Encounter: Payer: Self-pay | Admitting: Emergency Medicine

## 2022-06-16 DIAGNOSIS — R0602 Shortness of breath: Secondary | ICD-10-CM | POA: Diagnosis present

## 2022-06-16 DIAGNOSIS — R059 Cough, unspecified: Secondary | ICD-10-CM | POA: Insufficient documentation

## 2022-06-16 DIAGNOSIS — R0600 Dyspnea, unspecified: Secondary | ICD-10-CM | POA: Diagnosis not present

## 2022-06-16 DIAGNOSIS — I10 Essential (primary) hypertension: Secondary | ICD-10-CM | POA: Diagnosis not present

## 2022-06-16 NOTE — ED Triage Notes (Signed)
Patient c/o dry cough and shob since yesterday.

## 2022-06-17 ENCOUNTER — Emergency Department
Admission: EM | Admit: 2022-06-17 | Discharge: 2022-06-17 | Disposition: A | Discharge status: Home / Self Care | Payer: BC Managed Care – PPO | Attending: Emergency Medicine | Admitting: Emergency Medicine

## 2022-06-17 DIAGNOSIS — R0600 Dyspnea, unspecified: Secondary | ICD-10-CM

## 2022-06-17 LAB — BASIC METABOLIC PANEL
Anion gap: 11 (ref 5–15)
BUN: 14 mg/dL (ref 6–20)
CO2: 21 mmol/L — ABNORMAL LOW (ref 22–32)
Calcium: 8.9 mg/dL (ref 8.9–10.3)
Chloride: 103 mmol/L (ref 98–111)
Creatinine, Ser: 0.86 mg/dL (ref 0.44–1.00)
GFR, Estimated: >60 mL/min (ref >60)
Glucose, Bld: 96 mg/dL (ref 70–99)
Potassium: 3.5 mmol/L (ref 3.5–5.1)
Sodium: 135 mmol/L (ref 135–145)

## 2022-06-17 LAB — CBC WITH DIFFERENTIAL/PLATELET
Abs Immature Granulocytes: 0.06 10*3/uL (ref 0.00–0.07)
Basophils Absolute: 0 10*3/uL (ref 0.0–0.1)
Basophils Relative: 0 %
Eosinophils Absolute: 0 10*3/uL (ref 0.0–0.5)
Eosinophils Relative: 0 %
HCT: 40.1 % (ref 36.0–46.0)
Hemoglobin: 13.2 g/dL (ref 12.0–15.0)
Immature Granulocytes: 0 %
Lymphocytes Relative: 28 %
Lymphs Abs: 4.2 10*3/uL — ABNORMAL HIGH (ref 0.7–4.0)
MCH: 27.5 pg (ref 26.0–34.0)
MCHC: 32.9 g/dL (ref 30.0–36.0)
MCV: 83.5 fL (ref 80.0–100.0)
Monocytes Absolute: 1.1 10*3/uL — ABNORMAL HIGH (ref 0.1–1.0)
Monocytes Relative: 7 %
Neutro Abs: 9.5 10*3/uL — ABNORMAL HIGH (ref 1.7–7.7)
Neutrophils Relative %: 65 %
Platelets: 435 10*3/uL — ABNORMAL HIGH (ref 150–400)
RBC: 4.8 MIL/uL (ref 3.87–5.11)
RDW: 15.4 % (ref 11.5–15.5)
WBC: 14.9 10*3/uL — ABNORMAL HIGH (ref 4.0–10.5)
nRBC: 0 % (ref 0.0–0.2)

## 2022-06-17 LAB — URINALYSIS, ROUTINE W REFLEX MICROSCOPIC
Bacteria, UA: NONE SEEN
Bilirubin Urine: NEGATIVE
Glucose, UA: NEGATIVE mg/dL
Hgb urine dipstick: NEGATIVE
Ketones, ur: 20 mg/dL — AB
Nitrite: NEGATIVE
Protein, ur: 30 mg/dL — AB
Specific Gravity, Urine: 1.031 — ABNORMAL HIGH (ref 1.005–1.030)
pH: 5 (ref 5.0–8.0)

## 2022-06-17 LAB — TROPONIN I (HIGH SENSITIVITY)
Troponin I (High Sensitivity): 3 ng/L (ref <18)
Troponin I (High Sensitivity): 3 ng/L (ref <18)

## 2022-06-17 LAB — D-DIMER, QUANTITATIVE: D-Dimer, Quant: <0.27 ug/mL-FEU (ref 0.00–0.50)

## 2022-06-17 MED ORDER — LACTATED RINGERS IV BOLUS
1000.0000 mL | Freq: Once | INTRAVENOUS | Status: AC
  Administered 2022-06-17: 1000 mL via INTRAVENOUS

## 2022-06-17 NOTE — ED Provider Notes (Signed)
[facility] Provider Note    Event Date/Time   First MD Initiated Contact with Patient 06/17/22 [phone]     (approximate)   History   Cough and Shortness of Breath   HPI  Kim Ali is a 32 y.o. female who presents to the ED for evaluation of Cough and Shortness of Breath   I review a cardiology clinic visit from November.  Morbidly obese patient, BMI in the 60s, history of HTN on nifedipine and HCTZ.  Patient presents to the ED, alongside her grandmother, for evaluation of 2 days of dyspnea.  She reports sensation of difficulty breathing, difficulty taking a deep breath.  She reluctantly admits to a mild nonproductive cough alongside this but minimizes this.  Denies any productive cough, fever, chest discomfort, abdominal pain, syncope, dizziness or any other symptoms beyond dyspnea.  She has an IUD in place without any exogenous estrogen use. Denies orthopnea or any positional changes.  Reports symptoms have been intermittent for the past 2 days and without clear precipitating factors   Physical Exam   Triage Vital Signs: ED Triage Vitals  Enc Vitals Group     BP 06/16/22 2148 (!) 155/105     Pulse Rate 06/16/22 2148 (!) 114     Resp 06/16/22 2148 20     Temp 06/16/22 2148 98.6 F (37 C)     Temp Source 06/16/22 2148 Oral     SpO2 06/16/22 2148 100 %     Weight 06/16/22 2149 (!) 390 lb (176.9 kg)     Height 06/16/22 2149 5\' 6"  (1.676 m)     Head Circumference --      Peak Flow --      Pain Score 06/16/22 2149 0     Pain Loc --      Pain Edu? --      Excl. in GC? --     Most recent vital signs: Vitals:   06/17/22 0230 06/17/22 0238  BP:  131/86  Pulse: 86 99  Resp:  20  Temp:    SpO2: 100% 100%    General: Awake, no distress.  Morbidly obese.  Seems anxious. CV:  Good peripheral perfusion.  Tachycardic and regular Resp:  Normal effort.  Clear throughout, no tachypnea Abd:  No distention.  MSK:  No deformity noted.   Neuro:  No focal deficits appreciated. Other:     ED Results / Procedures / Treatments   Labs (all labs ordered are listed, but only abnormal results are displayed) Labs Reviewed  CBC WITH DIFFERENTIAL/PLATELET - Abnormal; Notable for the following components:      Result Value   WBC 14.9 (*)    Platelets 435 (*)    Neutro Abs 9.5 (*)    Lymphs Abs 4.2 (*)    Monocytes Absolute 1.1 (*)    All other components within normal limits  BASIC METABOLIC PANEL - Abnormal; Notable for the following components:   CO2 21 (*)    All other components within normal limits  URINALYSIS, ROUTINE W REFLEX MICROSCOPIC - Abnormal; Notable for the following components:   Color, Urine YELLOW (*)    APPearance CLOUDY (*)    Specific Gravity, Urine 1.031 (*)    Ketones, ur 20 (*)    Protein, ur 30 (*)    Leukocytes,Ua MODERATE (*)    All other components within normal limits  URINE CULTURE  D-DIMER, QUANTITATIVE  TROPONIN I (HIGH SENSITIVITY)  TROPONIN I (HIGH SENSITIVITY)  EKG Sinus rhythm at a rate of 99 bpm.  Normal axis and intervals.  No clear signs of acute ischemia.  RADIOLOGY CXR interpreted by me without evidence of acute cardiopulmonary pathology.  Official radiology report(s): DG Chest 2 View  Result Date: 06/16/2022 CLINICAL DATA:  Cough and shortness of breath EXAM: CHEST - 2 VIEW COMPARISON:  None Available. FINDINGS: The heart size and mediastinal contours are within normal limits. Both lungs are clear. The visualized skeletal structures are unremarkable. IMPRESSION: No active cardiopulmonary disease. Electronically Signed   By: Darliss Cheney M.D.   On: 06/16/2022 22:26    PROCEDURES and INTERVENTIONS:  .1-3 Lead EKG Interpretation  Performed by: Delton Prairie, MD Authorized by: Delton Prairie, MD     Interpretation: normal     ECG rate:  98   ECG rate assessment: normal     Rhythm: sinus rhythm     Ectopy: none     Conduction: normal     Medications  lactated  ringers bolus 1,000 mL (0 mLs Intravenous Stopped 06/17/22 0500)     IMPRESSION / MDM / ASSESSMENT AND PLAN / ED COURSE  I reviewed the triage vital signs and the nursing notes.  Differential diagnosis includes, but is not limited to, ACS, new onset CHF, bronchitis or viral syndrome, PE, pneumothorax, anxiety  {Patient presents with symptoms of an acute illness or injury that is potentially life-threatening.  Patient presents to the ED for evaluation of 2 days of intermittent dyspnea without clear signs of acute pathology and ultimately suitable for trial of outpatient management with close follow-up.  Triage tachycardia is noted and self resolves.  Her EKG is nonischemic and she is 2 negative troponins.  D-dimer is negative.  CXR is clear.  Normal metabolic panel and hemoglobin.  Increase of all cell lines of her CBC suggestive of dehydration alongside the ketones in her urine.  Possibly related to an underlying viral syndrome or bronchitis considering her nonproductive cough.  While I considered medical admission for this patient, she is ambulatory and has reassuring oxygen saturations.  Will be discharged with referral back to cardiology and close return precautions.  Clinical Course as of 06/17/22 0537  Mon Jun 17, 2022  0453 Reassessed and discussed reassuring workup so far.  Provided reassurance. [DS]    Clinical Course User Index [DS] Delton Prairie, MD     FINAL CLINICAL IMPRESSION(S) / ED DIAGNOSES   Final diagnoses:  Dyspnea, unspecified type     Rx / DC Orders   ED Discharge Orders     None        Note:  This document was prepared using Dragon voice recognition software and may include unintentional dictation errors.   Delton Prairie, MD 06/17/22 [phone]

## 2022-06-18 LAB — URINE CULTURE: Culture: <10000 — AB

## 2022-07-02 ENCOUNTER — Ambulatory Visit: Payer: BC Managed Care – PPO | Admitting: Physician Assistant

## 2022-07-03 ENCOUNTER — Ambulatory Visit: Payer: Self-pay | Admitting: Family Medicine

## 2022-07-05 ENCOUNTER — Ambulatory Visit: Payer: BC Managed Care – PPO | Attending: Physician Assistant | Admitting: Nurse Practitioner

## 2022-07-05 NOTE — Progress Notes (Deleted)
    Office Visit    Patient Name: Kim Ali Date of Encounter: 07/05/2022  Primary Care Provider:  [facility] Primary Cardiologist:  None  Chief Complaint    32 year old female with history of hypertension and obesity, presents for follow-up related to hypertension.  Past Medical History    Past Medical History:  Diagnosis Date   Hypertension    Morbid obesity with BMI of 60.0-69.9, adult (HCC)    Trichomoniasis 01/2017   Past Surgical History:  Procedure Laterality Date   NO PAST SURGERIES      Allergies  Allergies  Allergen Reactions   Penicillin G Other (See Comments)    Unknown per Pt    History of Present Illness    32 year old female with history of hypertension and obesity.  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  She was last seen in cardiology clinic in November 2023 with recommendation for follow-up echo, which is yet to be performed.  Home Medications    Current Outpatient Medications  Medication Sig Dispense Refill   hydrochlorothiazide (MICROZIDE) 12.5 MG capsule Take 1 capsule (12.5 mg total) by mouth daily. 90 capsule 3   NIFEdipine (PROCARDIA XL/NIFEDICAL-XL) 90 MG 24 hr tablet Take 1 tablet (90 mg total) by mouth daily. 30 tablet 2   No current facility-administered medications for this visit.     Review of Systems    ***.  All other systems reviewed and are otherwise negative except as noted above.    Physical Exam    VS:  There were no vitals taken for this visit. , BMI There is no height or weight on file to calculate BMI.     GEN: Well nourished, well developed, in no acute distress. HEENT: normal. Neck: Supple, no JVD, carotid bruits, or masses. Cardiac: RRR, no murmurs, rubs, or gallops. No clubbing, cyanosis, edema.  Radials 2+/PT 2+ and equal bilaterally.  Respiratory:  Respirations regular and  unlabored, clear to auscultation bilaterally. GI: Soft, nontender, nondistended, BS + x 4. MS: no deformity or atrophy. Skin: warm and dry, no rash. Neuro:  Strength and sensation are intact. Psych: Normal affect.  Accessory Clinical Findings    ECG personally reviewed by me today - *** - no acute changes.  Lab Results  Component Value Date   WBC 14.9 (H) 06/17/2022   HGB 13.2 06/17/2022   HCT 40.1 06/17/2022   MCV 83.5 06/17/2022   PLT 435 (H) 06/17/2022   Lab Results  Component Value Date   CREATININE 0.86 06/17/2022   BUN 14 06/17/2022   NA 135 06/17/2022   K 3.5 06/17/2022   CL 103 06/17/2022   CO2 21 (L) 06/17/2022   Lab Results  Component Value Date   ALT 15 09/21/2021   AST 16 09/21/2021   ALKPHOS 129 (H) 09/21/2021   BILITOT 0.5 09/21/2021   No results found for: "CHOL", "HDL", "LDLCALC", "LDLDIRECT", "TRIG", "CHOLHDL"  Lab Results  Component Value Date   HGBA1C 5.5 03/05/2021    Assessment & Plan    1.  ***   Nicolasa Ducking, NP 07/05/2022, 8:20 AM

## 2022-07-08 ENCOUNTER — Encounter: Payer: Self-pay | Admitting: Nurse Practitioner

## 2023-02-09 ENCOUNTER — Emergency Department: Payer: BC Managed Care – PPO

## 2023-02-09 ENCOUNTER — Emergency Department
Admission: EM | Admit: 2023-02-09 | Discharge: 2023-02-09 | Disposition: A | Discharge status: Home / Self Care | Payer: BC Managed Care – PPO | Attending: Student in an Organized Health Care Education/Training Program | Admitting: Student in an Organized Health Care Education/Training Program

## 2023-02-09 ENCOUNTER — Encounter: Payer: Self-pay | Admitting: Emergency Medicine

## 2023-02-09 DIAGNOSIS — Y92480 Sidewalk as the place of occurrence of the external cause: Secondary | ICD-10-CM | POA: Diagnosis not present

## 2023-02-09 DIAGNOSIS — I10 Essential (primary) hypertension: Secondary | ICD-10-CM | POA: Insufficient documentation

## 2023-02-09 DIAGNOSIS — S93602A Unspecified sprain of left foot, initial encounter: Secondary | ICD-10-CM | POA: Insufficient documentation

## 2023-02-09 DIAGNOSIS — M79672 Pain in left foot: Secondary | ICD-10-CM | POA: Diagnosis present

## 2023-02-09 DIAGNOSIS — W101XXA Fall (on)(from) sidewalk curb, initial encounter: Secondary | ICD-10-CM | POA: Diagnosis not present

## 2023-02-09 NOTE — Discharge Instructions (Addendum)
There is no evidence of any new or acute fracture or dislocation to your foot.  Your symptoms are consistent with a foot sprain.  Take OTC ibuprofen for pain relief.  Rest with the foot elevated and apply ice to reduce swelling.

## 2023-02-09 NOTE — ED Provider Notes (Signed)
[facility] Emergency Department Provider Note     Event Date/Time   First MD Initiated Contact with Patient 02/09/23 1947     (approximate)   History   Foot Injury   HPI  Kim Ali is a 32 y.o. female with a history of morbid obesity and hypertension, presents to the ED for evaluation of acute left foot pain.  Patient reports feeling audible "crack" to her foot as she stepped off a curb.  She does admit to a fall but denies hitting her head.  No other injury reported at this time.  She presents to the ED for acute left foot pain.  Physical Exam   Triage Vital Signs: ED Triage Vitals  Encounter Vitals Group     BP 02/09/23 1944 125/78     Systolic BP Percentile --      Diastolic BP Percentile --      Pulse Rate 02/09/23 1944 91     Resp 02/09/23 1944 20     Temp 02/09/23 1944 98.1 F (36.7 C)     Temp Source 02/09/23 1944 Oral     SpO2 02/09/23 1944 100 %     Weight --      Height 02/09/23 1943 5\' 6"  (1.676 m)     Head Circumference --      Peak Flow --      Pain Score 02/09/23 1943 8     Pain Loc --      Pain Education --      Exclude from Growth Chart --     Most recent vital signs: Vitals:   02/09/23 1944  BP: 125/78  Pulse: 91  Resp: 20  Temp: 98.1 F (36.7 C)  SpO2: 100%    General Awake, no distress. NAD HEENT NCAT. PERRL. EOMI. No rhinorrhea. Mucous membranes are moist. CV:  Good peripheral perfusion.  RESP:  Normal effort.  ABD:  No distention.  MSK:  Left foot without obvious deformity, effusion, or dislocation.  No bruising, ecchymosis, or erythema noted active range of motion of the ankle on exam.  No calf or Achilles tenderness noted distally.  Mild tender palpation over the left lateral deltoid region.   ED Results / Procedures / Treatments   Labs (all labs ordered are listed, but only abnormal results are displayed) Labs Reviewed - No data to display    EKG   RADIOLOGY  I personally viewed  and evaluated these images as part of my medical decision making, as well as reviewing the written report by the radiologist.  ED Provider Interpretation: No acute fracture or dislocation  DG Left Foot  IMPRESSION: No acute displaced fracture or dislocation.  PROCEDURES:  Critical Care performed: No  Procedures   MEDICATIONS ORDERED IN ED: Medications - No data to display   IMPRESSION / MDM / ASSESSMENT AND PLAN / ED COURSE  I reviewed the triage vital signs and the nursing notes.                              Differential diagnosis includes, but is not limited to, foot sprain, foot fracture, ankle sprain, ankle dislocation  Patient's presentation is most consistent with acute complicated illness / injury requiring diagnostic workup.  Patient's diagnosis is consistent with left foot sprain.  No acute bony injury as a matter patient of images.  Patient will be discharged home with tractions take OTC Tylenol and Motrin as  needed.  She was placed in a postop shoe for comfort.  Patient is to follow up with dietary as discussed, as needed or otherwise directed. Patient is given ED precautions to return to the ED for any worsening or new symptoms.  FINAL CLINICAL IMPRESSION(S) / ED DIAGNOSES   Final diagnoses:  Sprain of left foot, initial encounter     Rx / DC Orders   ED Discharge Orders     None        Note:  This document was prepared using Dragon voice recognition software and may include unintentional dictation errors.    Lissa Hoard, PA-C 02/12/23 2338    Willy Eddy, MD 02/13/23 [phone]

## 2023-02-09 NOTE — ED Triage Notes (Signed)
Pt c/o left foot pain/swelling post stepping off curb and felling left foot "crack," causing pt to fall. Pt denies hitting her head and denies LOC. Pt take blood thinner (Eliquis) for previous PE diagnosis in May.

## 2023-04-17 ENCOUNTER — Ambulatory Visit: Payer: BC Managed Care – PPO | Admitting: Obstetrics and Gynecology
